# Patient Record
Sex: Male | Born: 1940 | Race: Black or African American | Hispanic: No | Smoking: Former smoker
Health system: Southern US, Community
[De-identification: ages and names within clinical notes are randomized; demographics above are authoritative.]

## PROBLEM LIST (undated history)

## (undated) DIAGNOSIS — T7840XA Allergy, unspecified, initial encounter: Secondary | ICD-10-CM

## (undated) DIAGNOSIS — I10 Essential (primary) hypertension: Secondary | ICD-10-CM

## (undated) HISTORY — DX: Allergy, unspecified, initial encounter: T78.40XA

## (undated) HISTORY — DX: Essential (primary) hypertension: I10

---

## 1998-01-04 ENCOUNTER — Encounter: Admission: RE | Admit: 1998-01-04 | Discharge: 1998-01-04 | Payer: Self-pay | Admitting: Family Medicine

## 1998-01-11 ENCOUNTER — Encounter: Admission: RE | Admit: 1998-01-11 | Discharge: 1998-01-11 | Payer: Self-pay | Admitting: Family Medicine

## 1998-01-25 ENCOUNTER — Encounter: Admission: RE | Admit: 1998-01-25 | Discharge: 1998-01-25 | Payer: Self-pay | Admitting: Family Medicine

## 1998-02-01 ENCOUNTER — Encounter: Admission: RE | Admit: 1998-02-01 | Discharge: 1998-02-01 | Payer: Self-pay | Admitting: Family Medicine

## 1998-02-08 ENCOUNTER — Encounter: Admission: RE | Admit: 1998-02-08 | Discharge: 1998-02-08 | Payer: Self-pay | Admitting: Family Medicine

## 1998-02-15 ENCOUNTER — Encounter: Admission: RE | Admit: 1998-02-15 | Discharge: 1998-02-15 | Payer: Self-pay | Admitting: Family Medicine

## 1998-03-08 ENCOUNTER — Encounter: Admission: RE | Admit: 1998-03-08 | Discharge: 1998-03-08 | Payer: Self-pay | Admitting: Family Medicine

## 1998-03-15 ENCOUNTER — Encounter: Admission: RE | Admit: 1998-03-15 | Discharge: 1998-03-15 | Payer: Self-pay | Admitting: Family Medicine

## 1998-04-05 ENCOUNTER — Encounter: Admission: RE | Admit: 1998-04-05 | Discharge: 1998-04-05 | Payer: Self-pay | Admitting: Family Medicine

## 1998-04-19 ENCOUNTER — Encounter: Admission: RE | Admit: 1998-04-19 | Discharge: 1998-04-19 | Payer: Self-pay | Admitting: Family Medicine

## 1998-05-10 ENCOUNTER — Encounter: Admission: RE | Admit: 1998-05-10 | Discharge: 1998-05-10 | Payer: Self-pay | Admitting: Family Medicine

## 1998-05-31 ENCOUNTER — Encounter: Admission: RE | Admit: 1998-05-31 | Discharge: 1998-05-31 | Payer: Self-pay | Admitting: Family Medicine

## 1998-06-14 ENCOUNTER — Encounter: Admission: RE | Admit: 1998-06-14 | Discharge: 1998-06-14 | Payer: Self-pay | Admitting: Family Medicine

## 1998-07-05 ENCOUNTER — Encounter: Admission: RE | Admit: 1998-07-05 | Discharge: 1998-07-05 | Payer: Self-pay | Admitting: Family Medicine

## 1998-07-12 ENCOUNTER — Encounter: Admission: RE | Admit: 1998-07-12 | Discharge: 1998-07-12 | Payer: Self-pay | Admitting: Family Medicine

## 1998-07-19 ENCOUNTER — Encounter: Admission: RE | Admit: 1998-07-19 | Discharge: 1998-07-19 | Payer: Self-pay | Admitting: Family Medicine

## 1998-08-02 ENCOUNTER — Encounter: Admission: RE | Admit: 1998-08-02 | Discharge: 1998-08-02 | Payer: Self-pay | Admitting: Family Medicine

## 1998-08-09 ENCOUNTER — Encounter: Admission: RE | Admit: 1998-08-09 | Discharge: 1998-08-09 | Payer: Self-pay | Admitting: Family Medicine

## 1998-08-30 ENCOUNTER — Encounter: Admission: RE | Admit: 1998-08-30 | Discharge: 1998-08-30 | Payer: Self-pay | Admitting: Family Medicine

## 1998-09-04 ENCOUNTER — Encounter: Admission: RE | Admit: 1998-09-04 | Discharge: 1998-09-04 | Payer: Self-pay | Admitting: Family Medicine

## 1998-09-13 ENCOUNTER — Encounter: Admission: RE | Admit: 1998-09-13 | Discharge: 1998-09-13 | Payer: Self-pay | Admitting: Family Medicine

## 1998-10-04 ENCOUNTER — Encounter: Admission: RE | Admit: 1998-10-04 | Discharge: 1998-10-04 | Payer: Self-pay | Admitting: Family Medicine

## 1998-10-14 ENCOUNTER — Encounter: Admission: RE | Admit: 1998-10-14 | Discharge: 1998-10-14 | Payer: Self-pay | Admitting: Family Medicine

## 1998-10-25 ENCOUNTER — Encounter: Admission: RE | Admit: 1998-10-25 | Discharge: 1998-10-25 | Payer: Self-pay | Admitting: Family Medicine

## 1998-11-01 ENCOUNTER — Encounter: Admission: RE | Admit: 1998-11-01 | Discharge: 1998-11-01 | Payer: Self-pay | Admitting: Family Medicine

## 1998-11-15 ENCOUNTER — Encounter: Admission: RE | Admit: 1998-11-15 | Discharge: 1998-11-15 | Payer: Self-pay | Admitting: Family Medicine

## 1999-07-07 ENCOUNTER — Encounter: Admission: RE | Admit: 1999-07-07 | Discharge: 1999-07-07 | Payer: Self-pay | Admitting: Family Medicine

## 1999-10-02 ENCOUNTER — Encounter: Admission: RE | Admit: 1999-10-02 | Discharge: 1999-10-02 | Payer: Self-pay | Admitting: Family Medicine

## 1999-10-24 ENCOUNTER — Encounter: Admission: RE | Admit: 1999-10-24 | Discharge: 1999-10-24 | Payer: Self-pay | Admitting: Family Medicine

## 1999-11-14 ENCOUNTER — Encounter: Admission: RE | Admit: 1999-11-14 | Discharge: 1999-11-14 | Payer: Self-pay | Admitting: Family Medicine

## 2000-02-06 ENCOUNTER — Encounter: Admission: RE | Admit: 2000-02-06 | Discharge: 2000-02-06 | Payer: Self-pay | Admitting: Family Medicine

## 2000-07-23 ENCOUNTER — Encounter: Admission: RE | Admit: 2000-07-23 | Discharge: 2000-07-23 | Payer: Self-pay | Admitting: Family Medicine

## 2000-08-06 ENCOUNTER — Encounter: Admission: RE | Admit: 2000-08-06 | Discharge: 2000-08-06 | Payer: Self-pay | Admitting: Family Medicine

## 2000-08-26 ENCOUNTER — Encounter: Admission: RE | Admit: 2000-08-26 | Discharge: 2000-08-26 | Payer: Self-pay | Admitting: Sports Medicine

## 2001-02-17 ENCOUNTER — Encounter: Admission: RE | Admit: 2001-02-17 | Discharge: 2001-02-17 | Payer: Self-pay | Admitting: Family Medicine

## 2001-03-14 ENCOUNTER — Encounter: Admission: RE | Admit: 2001-03-14 | Discharge: 2001-03-14 | Payer: Self-pay | Admitting: Family Medicine

## 2001-05-20 ENCOUNTER — Encounter: Admission: RE | Admit: 2001-05-20 | Discharge: 2001-05-20 | Payer: Self-pay | Admitting: Family Medicine

## 2001-06-10 ENCOUNTER — Encounter: Admission: RE | Admit: 2001-06-10 | Discharge: 2001-06-10 | Payer: Self-pay | Admitting: Family Medicine

## 2001-08-05 ENCOUNTER — Encounter: Admission: RE | Admit: 2001-08-05 | Discharge: 2001-08-05 | Payer: Self-pay | Admitting: Pediatrics

## 2001-08-05 ENCOUNTER — Ambulatory Visit (HOSPITAL_COMMUNITY): Admission: RE | Admit: 2001-08-05 | Discharge: 2001-08-05 | Payer: Self-pay | Admitting: Family Medicine

## 2001-08-18 ENCOUNTER — Encounter: Admission: RE | Admit: 2001-08-18 | Discharge: 2001-08-18 | Payer: Self-pay | Admitting: Family Medicine

## 2001-08-18 ENCOUNTER — Encounter: Payer: Self-pay | Admitting: Family Medicine

## 2001-08-24 ENCOUNTER — Encounter: Admission: RE | Admit: 2001-08-24 | Discharge: 2001-08-24 | Payer: Self-pay | Admitting: Family Medicine

## 2001-12-02 ENCOUNTER — Encounter: Payer: Self-pay | Admitting: Family Medicine

## 2001-12-02 ENCOUNTER — Encounter: Admission: RE | Admit: 2001-12-02 | Discharge: 2001-12-02 | Payer: Self-pay | Admitting: Family Medicine

## 2001-12-19 ENCOUNTER — Encounter: Admission: RE | Admit: 2001-12-19 | Discharge: 2001-12-19 | Payer: Self-pay | Admitting: Family Medicine

## 2002-05-05 ENCOUNTER — Encounter: Admission: RE | Admit: 2002-05-05 | Discharge: 2002-05-05 | Payer: Self-pay | Admitting: Family Medicine

## 2002-05-26 ENCOUNTER — Encounter: Admission: RE | Admit: 2002-05-26 | Discharge: 2002-05-26 | Payer: Self-pay | Admitting: Family Medicine

## 2002-08-02 ENCOUNTER — Encounter: Admission: RE | Admit: 2002-08-02 | Discharge: 2002-08-02 | Payer: Self-pay | Admitting: Family Medicine

## 2002-10-13 ENCOUNTER — Encounter: Admission: RE | Admit: 2002-10-13 | Discharge: 2002-10-13 | Payer: Self-pay | Admitting: Family Medicine

## 2002-10-13 ENCOUNTER — Encounter: Admission: RE | Admit: 2002-10-13 | Discharge: 2002-10-13 | Payer: Self-pay | Admitting: Sports Medicine

## 2002-10-13 ENCOUNTER — Encounter: Payer: Self-pay | Admitting: Sports Medicine

## 2003-08-10 ENCOUNTER — Encounter: Admission: RE | Admit: 2003-08-10 | Discharge: 2003-08-10 | Payer: Self-pay | Admitting: Family Medicine

## 2003-08-10 ENCOUNTER — Ambulatory Visit (HOSPITAL_COMMUNITY): Admission: RE | Admit: 2003-08-10 | Discharge: 2003-08-10 | Payer: Self-pay | Admitting: Family Medicine

## 2004-07-14 ENCOUNTER — Ambulatory Visit: Payer: Self-pay | Admitting: Family Medicine

## 2004-08-11 ENCOUNTER — Ambulatory Visit: Payer: Self-pay | Admitting: Family Medicine

## 2004-09-02 ENCOUNTER — Ambulatory Visit: Payer: Self-pay | Admitting: Sports Medicine

## 2005-06-09 ENCOUNTER — Ambulatory Visit: Payer: Self-pay | Admitting: Family Medicine

## 2005-07-27 ENCOUNTER — Ambulatory Visit: Payer: Self-pay | Admitting: Family Medicine

## 2005-08-10 ENCOUNTER — Ambulatory Visit: Payer: Self-pay | Admitting: Family Medicine

## 2005-10-26 ENCOUNTER — Ambulatory Visit: Payer: Self-pay | Admitting: Family Medicine

## 2006-03-05 ENCOUNTER — Ambulatory Visit: Payer: Self-pay | Admitting: Family Medicine

## 2006-03-08 ENCOUNTER — Ambulatory Visit (HOSPITAL_COMMUNITY): Admission: RE | Admit: 2006-03-08 | Discharge: 2006-03-08 | Payer: Self-pay | Admitting: Family Medicine

## 2006-03-29 ENCOUNTER — Ambulatory Visit: Payer: Self-pay | Admitting: Family Medicine

## 2006-08-06 ENCOUNTER — Ambulatory Visit: Payer: Self-pay | Admitting: Family Medicine

## 2006-08-17 ENCOUNTER — Encounter: Admission: RE | Admit: 2006-08-17 | Discharge: 2006-08-17 | Payer: Self-pay | Admitting: Family Medicine

## 2006-11-25 DIAGNOSIS — I10 Essential (primary) hypertension: Secondary | ICD-10-CM

## 2006-11-25 DIAGNOSIS — J309 Allergic rhinitis, unspecified: Secondary | ICD-10-CM | POA: Insufficient documentation

## 2006-11-25 DIAGNOSIS — M479 Spondylosis, unspecified: Secondary | ICD-10-CM | POA: Insufficient documentation

## 2006-11-25 HISTORY — DX: Essential (primary) hypertension: I10

## 2006-12-09 ENCOUNTER — Telehealth: Payer: Self-pay | Admitting: Family Medicine

## 2007-08-01 ENCOUNTER — Ambulatory Visit: Payer: Self-pay | Admitting: Family Medicine

## 2007-08-01 LAB — CONVERTED CEMR LAB
Albumin: 4.6 g/dL (ref 3.5–5.2)
BUN: 18 mg/dL (ref 6–23)
CO2: 25 meq/L (ref 19–32)
Calcium: 9.6 mg/dL (ref 8.4–10.5)
Cholesterol: 177 mg/dL (ref 0–200)
Glucose, Bld: 75 mg/dL (ref 70–99)
HDL: 77 mg/dL (ref >39)
Hemoglobin: 13.8 g/dL (ref 13.0–17.0)
Potassium: 4.3 meq/L (ref 3.5–5.3)
RBC: 4.73 M/uL (ref 4.22–5.81)
Sodium: 142 meq/L (ref 135–145)
Total Protein: 8.1 g/dL (ref 6.0–8.3)
Triglycerides: 47 mg/dL (ref <150)
WBC: 4.2 10*3/uL (ref 4.0–10.5)

## 2007-11-04 ENCOUNTER — Encounter: Payer: Self-pay | Admitting: Family Medicine

## 2007-12-27 ENCOUNTER — Encounter: Payer: Self-pay | Admitting: Family Medicine

## 2007-12-27 LAB — CONVERTED CEMR LAB
ALT: 29 units/L
AST: 27 units/L
Albumin: 4.4 g/dL
Alkaline Phosphatase: 46 units/L
CO2: 30 meq/L
HDL: 76 mg/dL
Total Protein: 8.5 g/dL

## 2008-01-03 ENCOUNTER — Encounter: Payer: Self-pay | Admitting: Family Medicine

## 2008-01-03 LAB — CONVERTED CEMR LAB
HCT: 42.1 %
Hemoglobin: 13.7 g/dL
MCV: 90.9 fL
Platelets: 231 10*3/uL
RBC: 4.63 M/uL

## 2008-01-31 ENCOUNTER — Encounter: Payer: Self-pay | Admitting: Family Medicine

## 2008-02-23 ENCOUNTER — Encounter: Payer: Self-pay | Admitting: Family Medicine

## 2008-07-20 ENCOUNTER — Telehealth: Payer: Self-pay | Admitting: Family Medicine

## 2008-08-01 ENCOUNTER — Ambulatory Visit: Payer: Self-pay | Admitting: Family Medicine

## 2008-08-01 ENCOUNTER — Encounter: Payer: Self-pay | Admitting: Family Medicine

## 2008-08-01 LAB — CONVERTED CEMR LAB
BUN: 20 mg/dL (ref 6–23)
CO2: 26 meq/L (ref 19–32)
Chloride: 102 meq/L (ref 96–112)
Potassium: 4.5 meq/L (ref 3.5–5.3)

## 2008-08-03 ENCOUNTER — Ambulatory Visit: Payer: Self-pay | Admitting: Family Medicine

## 2009-01-17 ENCOUNTER — Encounter: Payer: Self-pay | Admitting: Family Medicine

## 2009-06-25 ENCOUNTER — Ambulatory Visit: Payer: Self-pay | Admitting: Family Medicine

## 2009-06-25 LAB — CONVERTED CEMR LAB
AST: 25 units/L (ref 0–37)
Albumin: 4.8 g/dL (ref 3.5–5.2)
BUN: 21 mg/dL (ref 6–23)
CO2: 26 meq/L (ref 19–32)
Calcium: 9.5 mg/dL (ref 8.4–10.5)
Chloride: 101 meq/L (ref 96–112)
Glucose, Bld: 115 mg/dL — ABNORMAL HIGH (ref 70–99)
Potassium: 3.8 meq/L (ref 3.5–5.3)

## 2009-08-07 ENCOUNTER — Ambulatory Visit: Payer: Self-pay | Admitting: Family Medicine

## 2009-08-07 LAB — CONVERTED CEMR LAB
ALT: 29 units/L (ref 0–53)
AST: 30 units/L (ref 0–37)
Albumin: 4.7 g/dL (ref 3.5–5.2)
Alkaline Phosphatase: 46 units/L (ref 39–117)
BUN: 23 mg/dL (ref 6–23)
Creatinine, Ser: 1.03 mg/dL (ref 0.40–1.50)
HDL: 82 mg/dL (ref >39)
LDL Cholesterol: 81 mg/dL (ref 0–99)
PSA: 0.44 ng/mL (ref 0.10–4.00)
Potassium: 3.9 meq/L (ref 3.5–5.3)
Total CHOL/HDL Ratio: 2.1

## 2010-01-31 ENCOUNTER — Encounter: Payer: Self-pay | Admitting: Family Medicine

## 2010-01-31 LAB — CONVERTED CEMR LAB
Alkaline Phosphatase: 55 units/L
BUN: 24 mg/dL
Bilirubin, Direct: 0.2 mg/dL
Cholesterol: 174 mg/dL
Creatinine, Ser: 0.9 mg/dL
HDL: 90 mg/dL
LDL Cholesterol: 73 mg/dL
Triglycerides: 49 mg/dL

## 2010-07-22 ENCOUNTER — Encounter: Payer: Self-pay | Admitting: *Deleted

## 2010-07-30 ENCOUNTER — Encounter: Payer: Self-pay | Admitting: Family Medicine

## 2010-08-06 ENCOUNTER — Ambulatory Visit: Payer: Self-pay | Admitting: Family Medicine

## 2010-08-06 LAB — CONVERTED CEMR LAB: PSA: 0.42 ng/mL (ref <=4.00)

## 2010-10-30 NOTE — Miscellaneous (Signed)
Summary: received flu vaccine  Clinical Lists Changes received notification from  [facility] that patient received flu vaccine 07/05/2010. Theresia Lo RN  July 22, 2010 5:03 PM  Observations: Added new observation of FLU VAX: Historical (07/05/2010 17:03)      Influenza Immunization History:    Influenza # 1:  Historical (07/05/2010)

## 2010-10-30 NOTE — Miscellaneous (Signed)
[facility]  Clinical Lists Changes  Observations: Added new observation of OTHER X-RAY: Exam Type: Knee left knee, spurring - unchanged from 3/09 Exam Type: Shoulder Left shoulder spurring  (01/31/2010 10:43) Added new observation of LDL: 73 mg/dL (04/54/0981 19:14) Added new observation of HDL: 90 mg/dL (78/29/5621 30:86) Added new observation of TRIGLYC TOT: 49 mg/dL (57/84/6962 95:28) Added new observation of CHOLESTEROL: 174 mg/dL (41/32/4401 02:72) Added new observation of PSA: 2.01 ng/mL (01/31/2010 10:40) Added new observation of CALCIUM: 9.2 mg/dL (53/66/4403 47:42) Added new observation of ALBUMIN: 4.1 g/dL (59/56/3875 64:33) Added new observation of PROTEIN, TOT: 8.4 g/dL (29/51/8841 66:06) Added new observation of SGPT (ALT): 33 units/L (01/31/2010 10:40) Added new observation of SGOT (AST): 29 units/L (01/31/2010 10:40) Added new observation of ALK PHOS: 55 units/L (01/31/2010 10:40) Added new observation of BILI DIRECT: 0.2 mg/dL (30/16/0109 32:35) Added new observation of BILI TOTAL: 0.9 mg/dL (57/32/2025 42:70) Added new observation of CREATININE: 0.9 mg/dL (62/37/6283 15:17) Added new observation of BUN: 24 mg/dL (61/60/7371 06:26) Added new observation of BG RANDOM: 73 mg/dL (94/85/4627 03:50) Added new observation of CO2 PLSM/SER: 27 meq/L (01/31/2010 10:40) Added new observation of CL SERUM: 102 meq/L (01/31/2010 10:40) Added new observation of K SERUM: 4.1 meq/L (01/31/2010 10:40) Added new observation of NA: 139 meq/L (01/31/2010 10:40) Added new observation of HGBA1C: 5.6 % (01/31/2010 10:40)      X-ray Musculoskeletal  Procedure date:  01/31/2010  Findings:      Exam Type: Knee left knee, spurring - unchanged from 3/09 Exam Type: Shoulder Left shoulder spurring    X-ray Musculoskeletal  Procedure date:  01/31/2010  Findings:      Exam Type: Knee left knee, spurring - unchanged from 3/09 Exam Type: Shoulder Left shoulder spurring

## 2010-10-30 NOTE — Assessment & Plan Note (Signed)
Summary: cpe,df   Vital Signs:  Patient profile:   70 year old male Weight:      203 pounds BMI:     29.23 Temp:     97.8 degrees F oral Pulse rate:   59 / minute Pulse rhythm:   regular BP sitting:   138 / 83  (left arm) Cuff size:   large  Vitals Entered By: Doralee Albino MD (August 06, 2010 3:05 PM) CC: cpe Is Patient Diabetic? No   CC:  cpe.  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  No bad health habits.  Has been to [place] and they did blood work which I copied and entered into EMR.  He is up to date on most things.  The only concern is a jump in his PSA although it is still in the normal range.    Habits & Providers  Alcohol-Tobacco-Diet     Alcohol drinks/day: <1     Tobacco Status: quit     Year Quit: 1976     Diet Comments: Healthy diet  Exercise-Depression-Behavior     Does Patient Exercise: yes     Exercise Counseling: not indicated; exercise is adequate     Type of exercise: walking     Have you felt down or hopeless? no     Have you felt little pleasure in things? no     Depression Counseling: not indicated; screening negative for depression     STD Risk: never     Drug Use: never     Seat Belt Use: always     Sun Exposure: infrequent  Current Medications (verified): 1)  Aspirin Ec 81 Mg Tbec (Aspirin) .... Take 1 Tablet By Mouth Every Morning 2)  Hydrochlorothiazide 25 Mg  Tabs (Hydrochlorothiazide) .... One Tab By Mouth Daily 3)  Lisinopril 20 Mg Tabs (Lisinopril) .... One By Mouth Twice Daily 4)  Loratadine 10 Mg Tabs (Loratadine) .... Take 1 Tablet By Mouth Once A Day 5)  Nasonex 50 Mcg/act Susp (Mometasone Furoate) .... Spray 2 Spray Into Both Nostrils At Bedtime 6)  Ra Fish Oil 1000 Mg Caps (Omega-3 Fatty Acids) .... Three By Mouth Daily 7)  Vitamin C 500 Mg Tabs (Ascorbic Acid) .... One By Mouth Daily  Allergies (verified): No Known Drug Allergies  Past History:  Past Medical  History: Last updated: 08/07/2009 Also takes Omega 3 vit qd, spinal stenosis by MRI, mild sx, zostavax 11/06  Had AAA screen 11/07  Past Surgical History: Last updated: 11/25/2006 08/06/06 ldl: 97 hdl: 74 tri: 40 - 60/45/4098, chest tube for spontaneous pneumothorax -, MRI LS spine - 03/30/2006  Family History: Last updated: 11/25/2006 - CVA, +DM, HBP, Ca, CAD  Social History: Last updated: 11/25/2006 non smoker; ETOH insignificant; exercises 3x/week regular and deep water running; diet very healthy; 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    Physical Exam  General:  Well-developed,well-nourished,in no acute distress; alert,appropriate and cooperative throughout examination Mouth:  Oral mucosa and oropharynx  without lesions or exudates.  Teeth in good repair. Neck:  No deformities, masses, or tenderness noted. Chest Wall:  No deformities, masses, tenderness or gynecomastia noted. Lungs:  Normal respiratory effort, chest expands symmetrically. Lungs are clear to auscultation, no crackles or wheezes. Heart:  Normal rate and regular rhythm. S1 and S2 normal without gallop, murmur, click, rub or other extra sounds. Abdomen:  Bowel sounds positive,abdomen soft and non-tender without masses, organomegaly or hernias noted. Msk:  No deformity or scoliosis noted of thoracic or lumbar spine.     Extremities:  No clubbing, cyanosis, edema, or deformity noted with normal full range of motion of all joints.   Neurologic:  No cranial nerve deficits noted. Station and gait are normal. Plantar reflexes are down-going bilaterally. DTRs are symmetrical throughout. Sensory, motor and coordinative functions appear intact.   Impression & Recommendations:  Problem # 1:  Preventive Health Care (ICD-V70.0)  very healthy  Orders: [facility] - [facility]  65+ [phone])  Problem # 2:  HYPERTENSION, BENIGN SYSTEMIC (ICD-401.1) Assessment: Unchanged  His updated medication list for this problem includes:    Hydrochlorothiazide 25 Mg Tabs (Hydrochlorothiazide) ..... One tab by mouth daily    Lisinopril 20 Mg Tabs (Lisinopril) ..... One by mouth twice daily  BP today: 138/83 Prior BP: 160/87 (08/07/2009)  Labs Reviewed: K+: 4.1 (01/31/2010) Creat: : 0.9 (01/31/2010)   Chol: 174 (01/31/2010)   HDL: 90 (01/31/2010)   LDL: 73 (01/31/2010)   TG: 49 (01/31/2010)  Problem # 3:  SPECIAL SCREENING MALIGNANT NEOPLASM OF PROSTATE (ICD-V76.44) Concerned for jump in PSA Orders: [facility] ([id number])  Complete Medication List: 1)  Aspirin Ec 81 Mg Tbec (Aspirin) .... Take 1 tablet by mouth every morning 2)  Hydrochlorothiazide 25 Mg Tabs (Hydrochlorothiazide) .... One tab by mouth daily 3)  Lisinopril 20 Mg Tabs (Lisinopril) .... One by mouth twice daily 4)  Loratadine 10 Mg Tabs (Loratadine) .... Take 1 tablet by mouth once a day 5)  Nasonex 50 Mcg/act Susp (Mometasone furoate) .... Spray 2 spray into both nostrils at bedtime 6)  Ra Fish Oil 1000 Mg Caps (Omega-3 fatty acids) .... Three by mouth daily 7)  Vitamin C 500 Mg Tabs (Ascorbic acid) .... One by mouth daily   Orders Added: 1)  [facility] [phone] 2)  [facility] - Est  65+ [[id number]]     Prevention & Chronic Care Immunizations   Influenza vaccine: Historical  (07/05/2010)   Influenza vaccine due: 05/30/2011    Tetanus booster: 07/29/2001: Done.    Tetanus booster due: 07/30/2011    Pneumococcal vaccine: Pneumovax  (06/25/2009)   Pneumococcal vaccine due: None    H. zoster vaccine: 08/12/2005: given  Colorectal Screening   Hemoccult: Done.  (07/29/2005)   Hemoccult due: Not Indicated    Colonoscopy: Done.  (08/28/2005)   Colonoscopy due: 08/29/2015  Other Screening   PSA: 2.01  (01/31/2010)   PSA ordered.   PSA action/deferral: Discussed-PSA requested  (08/07/2009)   PSA due due: 02/01/2011   Smoking status: quit  (08/06/2010)  Lipids   Total Cholesterol: 174  (01/31/2010)   LDL: 73  (01/31/2010)   LDL Direct: Not documented   HDL: 90  (01/31/2010)   Triglycerides: 49  (01/31/2010)  Hypertension   Last Blood Pressure: 138 / 83  (08/06/2010)   Serum creatinine: 0.9  (01/31/2010)   Serum potassium 4.1  (01/31/2010)    Hypertension flowsheet reviewed?: Yes   Progress toward BP goal: At goal  Self-Management Support :   Personal Goals (by  the next clinic visit) :      Personal blood pressure goal: 140/90  (06/25/2009)   Hypertension self-management support: Written self-care plan  (08/07/2009)

## 2010-11-19 ENCOUNTER — Encounter: Payer: Self-pay | Admitting: Family Medicine

## 2010-11-19 NOTE — Telephone Encounter (Signed)
This encounter was created in error - please disregard.

## 2010-12-09 ENCOUNTER — Encounter: Payer: Self-pay | Admitting: Home Health Services

## 2011-01-08 ENCOUNTER — Encounter: Payer: Self-pay | Admitting: Family Medicine

## 2011-01-08 DIAGNOSIS — M2240 Chondromalacia patellae, unspecified knee: Secondary | ICD-10-CM | POA: Insufficient documentation

## 2011-01-08 NOTE — Progress Notes (Signed)
  Subjective:    Patient ID: Adam Vasquez, male    DOB: 02/13/1941, 70 y.o.   MRN: [id number]  HPI Received records from outpatient ortho visit with [facility] for bilateral mild anterior knee pain with exercise.  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  No intervention    Review of Systems     Objective:   Physical Exam        Assessment & Plan:

## 2011-02-03 ENCOUNTER — Other Ambulatory Visit: Payer: Self-pay | Admitting: Family Medicine

## 2011-02-03 NOTE — Telephone Encounter (Signed)
Refill request

## 2011-03-04 ENCOUNTER — Telehealth: Payer: Self-pay | Admitting: Family Medicine

## 2011-03-04 NOTE — Telephone Encounter (Signed)
Is going to [place] and is asking about shots to go - Hep A,  Will be pulling chart to see if he has had any shots

## 2011-03-06 NOTE — Telephone Encounter (Signed)
He is asking that nurse call to see if there is any indication that he would need a Hep A to go to [place].

## 2011-03-06 NOTE — Telephone Encounter (Signed)
Will route to [employer].

## 2011-03-06 NOTE — Telephone Encounter (Signed)
Checked paper chart  and there is no indication that patient has ever had Hep A vaccine. . Patient notified.  He has contacted [facility] Dept. As to other immunizations that are needed.

## 2011-04-02 ENCOUNTER — Telehealth: Payer: Self-pay | Admitting: Family Medicine

## 2011-04-02 ENCOUNTER — Ambulatory Visit (INDEPENDENT_AMBULATORY_CARE_PROVIDER_SITE_OTHER): Payer: Medicare Other | Admitting: Family Medicine

## 2011-04-02 VITALS — BP 154/86 | HR 66 | Temp 99.2°F | Ht 70.0 in | Wt 205.0 lb

## 2011-04-02 DIAGNOSIS — R52 Pain, unspecified: Secondary | ICD-10-CM

## 2011-04-02 NOTE — Assessment & Plan Note (Signed)
Will do peripheral malaria smear today.  Most likely this is the start of a viral illness. [place] is low risk for malaria for tourist, when pt is taking prophylaxis.  Pt was [place] a higher risk area.  Will review results of smear.  Pt to monitor for new or worsening of symptoms and return promptly if any changes.  Pt states understanding.

## 2011-04-02 NOTE — Patient Instructions (Signed)
I will call you with the malaria smear results. Continue to take doxycycline as directed. Return for new or worsening of symptoms as discussed.

## 2011-04-02 NOTE — Progress Notes (Signed)
  Subjective:    Patient ID: Adam Vasquez, male    DOB: 1941-06-27, 70 y.o.   MRN: [id number]  HPI Got back from [place] on June 23rd (12 days ago).  Sweating, chills that started off and on since yesterday am.  Taking doxycylcine prophylaxis as directed.   + h/a, sorethroat yesterday, lower ext body aches, +fatigue (mild), no n/v, No cough.  No abdominal pain. No cold symptoms.  No problems with urination.  No diarrhea.  Pt states he is concerned that he may have malaria   Review of Systems    as per above Objective:   Physical Exam  Constitutional: He is oriented to person, place, and time. He appears well-developed and well-nourished.  HENT:  Mouth/Throat: Oropharynx is clear and moist.       Minimal throat erythema  Eyes: Pupils are equal, round, and reactive to light.  Neck: Normal range of motion.  Cardiovascular: Normal rate, regular rhythm and normal heart sounds.   No murmur heard. Pulmonary/Chest: Effort normal and breath sounds normal. No respiratory distress. He has no wheezes.  Abdominal: Soft. He exhibits no distension and no mass. There is no tenderness. There is no rebound and no guarding.  Neurological: He is alert and oriented to person, place, and time.  Skin: Skin is warm. No rash noted.          Assessment & Plan:

## 2011-04-02 NOTE — Telephone Encounter (Signed)
Call from pt who just returned from [place] on the 23rd of June, was feeling fine but for the last four days has been feeling very run down, fever and nausea and some vomiiting.  Pt was taking cipro daily but does not think it helped.  Pt would like to be seen.  Told pt to come in at 830 am and we will work him in for an appointment, will send to [facility] team

## 2011-04-03 ENCOUNTER — Encounter: Payer: Self-pay | Admitting: Family Medicine

## 2011-04-03 ENCOUNTER — Telehealth: Payer: Self-pay | Admitting: Family Medicine

## 2011-04-03 NOTE — Telephone Encounter (Signed)
Called pt to notify that the preliminary results are negative.  Final results of thick prep are pending.  If negative I will mail confirmation to pt.  If positive I will call pt and notify by phone. Pt states understanding.

## 2011-05-09 ENCOUNTER — Other Ambulatory Visit: Payer: Self-pay | Admitting: Family Medicine

## 2011-05-10 NOTE — Telephone Encounter (Signed)
Refill request

## 2011-05-19 ENCOUNTER — Ambulatory Visit (INDEPENDENT_AMBULATORY_CARE_PROVIDER_SITE_OTHER): Payer: Medicare Other | Admitting: Home Health Services

## 2011-05-19 ENCOUNTER — Encounter: Payer: Self-pay | Admitting: Home Health Services

## 2011-05-19 VITALS — BP 138/79 | HR 49 | Temp 97.9°F | Ht 69.5 in | Wt 197.5 lb

## 2011-05-19 DIAGNOSIS — Z0000 Encounter for general adult medical examination without abnormal findings: Secondary | ICD-10-CM

## 2011-05-19 NOTE — Progress Notes (Signed)
Patient here for annual wellness visit, patient reports: Risk Factors/Conditions needing evaluation or treatment: Pt does not have any risk factors that need evaluation.  Home Safety: Pt lives with wife in 2 story home.  Pt reports having smoke detectors and adaptive equipment in the bathroom.  Other Information: Corrective lens: Pt wears daily corrective lens and visits eye doctor every 36 months. Dentures: Pt does not have dentures and visits dentist every 6 months. Memory: Pt denies memory problems. Patient's Mini Mental Score (recorded in doc. flowsheet): 30  Balance/Gait: Pt does not have any noticeable impairments.  Balance Abnormal Patient value  Sitting balance    Sit to stand    Attempts to arise    Immediate standing balance    Standing balance    Nudge    Eyes closed- Romberg    Tandem stance    Back lean    Neck Rotation    360 degree turn    Sitting down     Gait Abnormal Patient value  Initiation of gait    Step length-left    Step length-right    Step height-left    Step height-right    Step symmetry    Step continuity    Path deviation    Trunk movement    Walking stance        Annual Wellness Visit Requirements Recorded Today In  Medical, family, social history Past Medical, Family, Social History Section  Current providers Care team  Current medications Medications  Wt, BP, Ht, BMI Vital signs  Hearing assessment (welcome visit) Hearing/vision  Tobacco, alcohol, illicit drug use History  ADL Nurse Assessment  Depression Screening Nurse Assessment  Cognitive impairment Nurse Assessment  Mini Mental Status Document Flowsheet  Fall Risk Nurse Assessment  Home Safety Progress Note  End of Life Planning (welcome visit) Social Documentation  Medicare preventative services Progress Note  Risk factors/conditions needing evaluation/treatment Progress Note  Personalized health advice Patient Instructions, goals, letter  Diet & Exercise Social  Documentation  Emergency Contact Social Documentation  Seat Belts Social Documentation  Sun exposure/protection Social Documentation    Medicare Prevention Plan: Pt is up to date with recommended screenings.   Recommended Medicare Prevention Screenings Men over 37 Test For Frequency Date of Last- BOLD if needed  Colorectal Cancer 1-10 yrs 12/06  Prostate Cancer Never or yearly 11/11  Aortic Aneurysm Once if 65-75 with hx of smoking Discuss with PCP if concerned  Cholesterol 5 yrs 5/11  Diabetes yearly 5/11  HIV yearly declined  Influenza Shot yearly 10/11  Pneumonia Shot once 9/10  Zostavax Shot once 11/06

## 2011-05-19 NOTE — Progress Notes (Signed)
  Subjective:    Patient ID: Adam Vasquez, male    DOB: 09/19/1941, 70 y.o.   MRN: [id number]  HPI I have reviewed this visit and discussed with Adam Vasquez and agree with her documentation.      Review of Systems     Objective:   Physical Exam        Assessment & Plan:

## 2011-05-19 NOTE — Patient Instructions (Signed)
1. Continue to exercise 3-4 times a week for 30 minutes. 2. Continue to focus on eating vegetables 3-4 times a day.

## 2011-08-12 ENCOUNTER — Encounter: Payer: Self-pay | Admitting: Family Medicine

## 2011-08-12 ENCOUNTER — Ambulatory Visit (INDEPENDENT_AMBULATORY_CARE_PROVIDER_SITE_OTHER): Payer: Medicare Other | Admitting: Family Medicine

## 2011-08-12 VITALS — BP 138/84 | Temp 97.6°F | Ht 69.5 in | Wt 198.6 lb

## 2011-08-12 DIAGNOSIS — Z23 Encounter for immunization: Secondary | ICD-10-CM

## 2011-08-12 DIAGNOSIS — Z125 Encounter for screening for malignant neoplasm of prostate: Secondary | ICD-10-CM

## 2011-08-12 DIAGNOSIS — I10 Essential (primary) hypertension: Secondary | ICD-10-CM

## 2011-08-12 DIAGNOSIS — Q828 Other specified congenital malformations of skin: Secondary | ICD-10-CM

## 2011-08-12 LAB — LIPID PANEL
Cholesterol: 179 mg/dL (ref 0–200)
Total CHOL/HDL Ratio: 2.4 Ratio
Triglycerides: 40 mg/dL (ref <150)
VLDL: 8 mg/dL (ref 0–40)

## 2011-08-12 LAB — COMPLETE METABOLIC PANEL WITH GFR
ALT: 19 U/L (ref 0–53)
Alkaline Phosphatase: 47 U/L (ref 39–117)
GFR, Est Non African American: 86 mL/min/{1.73_m2}
Sodium: 139 mEq/L (ref 135–145)
Total Bilirubin: 1.4 mg/dL — ABNORMAL HIGH (ref 0.3–1.2)
Total Protein: 8.2 g/dL (ref 6.0–8.3)

## 2011-08-12 NOTE — Patient Instructions (Signed)
You got a tetanus shot today. You are up to date on the recommended screening test. See me at your convenience for skin tag removal.  Stay off the aspirin for three days prior to the removal.

## 2011-08-13 ENCOUNTER — Encounter: Payer: Self-pay | Admitting: Family Medicine

## 2011-08-14 ENCOUNTER — Encounter: Payer: Medicare Other | Admitting: Family Medicine

## 2011-08-14 DIAGNOSIS — Q828 Other specified congenital malformations of skin: Secondary | ICD-10-CM | POA: Insufficient documentation

## 2011-08-14 NOTE — Assessment & Plan Note (Signed)
Return for elective removal. 

## 2011-08-14 NOTE — Assessment & Plan Note (Signed)
Well controled. 

## 2011-08-14 NOTE — Progress Notes (Signed)
  Subjective:    Patient ID: Adam Vasquez, male    DOB: Jun 23, 1941, 70 y.o.   MRN: [id number]  HPI  Not really an annual exam.  He has had his annual wellness exam recently.  He feels great, is exercising and eating healthy.  His only complaint is of skin tags on neck and bilateral axilla.    Review of Systems     Objective:   Physical Exam BP is well controled Cardiac RRR without m Lungs clear Abd benign Skin, multiple typical skin tags on neck and axilla        Assessment & Plan:

## 2011-08-14 NOTE — Assessment & Plan Note (Signed)
Given vigorous health, continue annual screening to age 70, then stop

## 2011-08-27 ENCOUNTER — Telehealth: Payer: Self-pay | Admitting: Family Medicine

## 2011-08-27 NOTE — Telephone Encounter (Signed)
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 Leveda Anna for being so kind and for calling with his lab results and sending them hrough the mail.  He wanted Dr. Cyndia Diver email address.

## 2011-08-28 NOTE — Telephone Encounter (Signed)
OK to give my e mail address [email or web address]

## 2011-08-28 NOTE — Telephone Encounter (Signed)
Gave email address to his wife.

## 2011-09-09 ENCOUNTER — Ambulatory Visit: Payer: Medicare Other | Admitting: Family Medicine

## 2011-09-18 ENCOUNTER — Encounter: Payer: Self-pay | Admitting: Family Medicine

## 2011-09-18 ENCOUNTER — Ambulatory Visit (INDEPENDENT_AMBULATORY_CARE_PROVIDER_SITE_OTHER): Payer: Medicare Other | Admitting: Family Medicine

## 2011-09-18 DIAGNOSIS — Q828 Other specified congenital malformations of skin: Secondary | ICD-10-CM

## 2011-09-18 NOTE — Assessment & Plan Note (Signed)
Removed and aftercare instructions given.

## 2011-09-18 NOTE — Progress Notes (Signed)
  Subjective:    Patient ID: Adam Vasquez, male    DOB: 1941/02/10, 70 y.o.   MRN: [id number]  HPI For skin tag removal Multiple skin tags in both axilla and at neck. Time out done and informed consent obtained.    Review of Systems     Objective:   Physical Exam  Multiple benign skin tags.  Pedunculated ones removed with cryo anesthesia and simple excision.  20 + tags removed.  Patient tolerated procedure well        Assessment & Plan:

## 2011-09-18 NOTE — Patient Instructions (Signed)
Keep clean and dry.  Use topical neosporin.  Notify me if any signs of infection.

## 2012-01-29 ENCOUNTER — Other Ambulatory Visit: Payer: Self-pay | Admitting: Family Medicine

## 2012-05-18 ENCOUNTER — Encounter: Payer: Self-pay | Admitting: Family Medicine

## 2012-05-18 DIAGNOSIS — Z125 Encounter for screening for malignant neoplasm of prostate: Secondary | ICD-10-CM

## 2012-05-18 DIAGNOSIS — I10 Essential (primary) hypertension: Secondary | ICD-10-CM

## 2012-05-18 NOTE — Assessment & Plan Note (Signed)
Outside labs 03/22/12 HDL=96, LDL=65 Normal BMP

## 2012-05-18 NOTE — Progress Notes (Signed)
Patient ID: Adam Vasquez, male   DOB: 15-Nov-1940, 71 y.o.   MRN: [id number] [facility] labs look good.

## 2012-05-23 ENCOUNTER — Encounter: Payer: Self-pay | Admitting: Home Health Services

## 2012-06-28 ENCOUNTER — Encounter: Payer: Self-pay | Admitting: Home Health Services

## 2012-06-28 ENCOUNTER — Ambulatory Visit (INDEPENDENT_AMBULATORY_CARE_PROVIDER_SITE_OTHER): Payer: Medicare Other | Admitting: Home Health Services

## 2012-06-28 VITALS — BP 144/81 | HR 53 | Temp 97.2°F | Ht 69.5 in | Wt 202.0 lb

## 2012-06-28 DIAGNOSIS — Z0000 Encounter for general adult medical examination without abnormal findings: Secondary | ICD-10-CM

## 2012-06-28 NOTE — Progress Notes (Signed)
Patient here for annual wellness visit, patient reports: Risk Factors/Conditions needing evaluation or treatment: Pt does not have any new risk factors that need evaluation. Home Safety: Pt lives with wife in 2 story home.  Pt reports having smoke detectors. Other Information: Corrective lens: Pt wears daily corrective lens.  Pt reports having eye exams semi-annually. Dentures: Pt does not have dentures. Memory: Pt denies any memory problems. Patient's Mini Mental Score (recorded in doc. flowsheet): 30  Balance/Gait: Pt does not have any noticeable impairment. Balance Abnormal Patient value  Sitting balance    Sit to stand    Attempts to arise    Immediate standing balance    Standing balance    Nudge    Eyes closed- Romberg    Tandem stance    Back lean    Neck Rotation    360 degree turn    Sitting down     Gait Abnormal Patient value  Initiation of gait    Step length-left    Step length-right    Step height-left    Step height-right    Step symmetry    Step continuity    Path deviation    Trunk movement    Walking stance        Annual Wellness Visit Requirements Recorded Today In  Medical, family, social history Past Medical, Family, Social History Section  Current providers Care team  Current medications Medications  Wt, BP, Ht, BMI Vital signs  Tobacco, alcohol, illicit drug use History  ADL Nurse Assessment  Depression Screening Nurse Assessment  Cognitive impairment Nurse Assessment  Mini Mental Status Document Flowsheet  Fall Risk Nurse Assessment  Home Safety Progress Note  End of Life Planning (welcome visit) Social Documentation  Medicare preventative services Progress Note  Risk factors/conditions needing evaluation/treatment Progress Note  Personalized health advice Patient Instructions, goals, letter  Diet & Exercise Social Documentation  Emergency Contact Social Documentation  Seat Belts Social Documentation  Sun exposure/protection Social  Documentation    Medicare Prevention Plan:   Recommended Medicare Prevention Screenings Men over 65 Test For Frequency Date of Last- BOLD if needed  Colorectal Cancer 1-10 yrs 12/06  Prostate Cancer Never or yearly NI  Aortic Aneurysm Once if 65-75 with hx of smoking Pt reported done  Cholesterol 5 yrs 11/12  Diabetes yearly 11/12  HIV yearly declined  Influenza Shot yearly Pt reported done  Pneumonia Shot once 9/10  Zostavax Shot once 11/06

## 2012-06-28 NOTE — Patient Instructions (Signed)
1. Continue exercising 150 minutes a week. 2. Continue focusing diet on fruits and vegetables. 3. Continue working on [employer]. 4. Keep record of blood pressure readings to watch for trend.

## 2012-06-29 ENCOUNTER — Encounter: Payer: Self-pay | Admitting: Home Health Services

## 2012-06-29 NOTE — Progress Notes (Signed)
Patient ID: Adam Vasquez, male   DOB: [birth year], 71 y.o.   MRN: [id number] I have reviewed this visit and discussed with [name] and agree with her documentation.

## 2012-08-19 ENCOUNTER — Ambulatory Visit (INDEPENDENT_AMBULATORY_CARE_PROVIDER_SITE_OTHER): Payer: Medicare Other | Admitting: Family Medicine

## 2012-08-19 ENCOUNTER — Encounter: Payer: Self-pay | Admitting: Family Medicine

## 2012-08-19 ENCOUNTER — Encounter: Payer: Medicare Other | Admitting: Family Medicine

## 2012-08-19 VITALS — BP 124/81 | HR 67 | Temp 97.9°F | Ht 70.0 in | Wt 201.0 lb

## 2012-08-19 DIAGNOSIS — I10 Essential (primary) hypertension: Secondary | ICD-10-CM

## 2012-08-19 DIAGNOSIS — Z0000 Encounter for general adult medical examination without abnormal findings: Secondary | ICD-10-CM | POA: Insufficient documentation

## 2012-08-19 LAB — COMPLETE METABOLIC PANEL WITH GFR
AST: 28 U/L (ref 0–37)
Albumin: 4.5 g/dL (ref 3.5–5.2)
BUN: 19 mg/dL (ref 6–23)
Calcium: 9.7 mg/dL (ref 8.4–10.5)
Chloride: 103 mEq/L (ref 96–112)
Glucose, Bld: 102 mg/dL — ABNORMAL HIGH (ref 70–99)
Potassium: 4.2 mEq/L (ref 3.5–5.3)
Total Protein: 7.8 g/dL (ref 6.0–8.3)

## 2012-08-19 LAB — CBC
MCHC: 33 g/dL (ref 30.0–36.0)
MCV: 89.1 fL (ref 78.0–100.0)
Platelets: 257 10*3/uL (ref 150–400)
RDW: 13.6 % (ref 11.5–15.5)
WBC: 4.1 10*3/uL (ref 4.0–10.5)

## 2012-08-19 LAB — LIPID PANEL: HDL: 72 mg/dL (ref >39)

## 2012-08-19 NOTE — Patient Instructions (Addendum)
Everything looks great.   Please stay on your blood pressure medicines.   I will call with lab results Sign up for My Chart. Have a great Thanksgiving. See me in 6 months.  Sooner if problems

## 2012-08-23 ENCOUNTER — Encounter: Payer: Self-pay | Admitting: Family Medicine

## 2012-08-23 NOTE — Progress Notes (Signed)
  Subjective:    Patient ID: Adam Vasquez, male    DOB: 1941/09/18, 71 y.o.   MRN: [id number]  HPI Very healthy 71 yo male in for annual physical.  Great health habits continue.  Compliant with meds although he would like to get off his HBP meds.  Healthy diet, exercises regularly.  Up to date on HPDP interventions.    Review of Systems  No cp, SOB, syncope, focal neuro sx, abd pain, change in bowel or bladder, bleeding or leg swelling.     Objective:   Physical Exam HEENT normal Neck supple, Lungs clear Cardiac RRR without m or g Abd benign Ext normal pulses without edema Neuro motor and sensory grossly intact.       Assessment & Plan:

## 2012-08-23 NOTE — Assessment & Plan Note (Signed)
Good control

## 2012-08-23 NOTE — Assessment & Plan Note (Signed)
Very healthy, Labs as ordered.

## 2012-10-12 ENCOUNTER — Encounter: Payer: Self-pay | Admitting: Family Medicine

## 2012-10-12 ENCOUNTER — Ambulatory Visit (INDEPENDENT_AMBULATORY_CARE_PROVIDER_SITE_OTHER): Payer: Medicare Other | Admitting: Family Medicine

## 2012-10-12 VITALS — BP 156/88 | HR 60 | Temp 98.7°F

## 2012-10-12 DIAGNOSIS — L821 Other seborrheic keratosis: Secondary | ICD-10-CM

## 2012-10-12 DIAGNOSIS — D492 Neoplasm of unspecified behavior of bone, soft tissue, and skin: Secondary | ICD-10-CM

## 2012-10-12 NOTE — Patient Instructions (Addendum)
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  Please google for more information.  You may be a person who grows several of these.  I will call you with the pathology results - but it may not be until one week from Monday.   Treat the biopsy site like any other skin blister.  Keep clean and cover.  Neosporin.  Call if any signs of infection.

## 2012-10-14 DIAGNOSIS — L821 Other seborrheic keratosis: Secondary | ICD-10-CM

## 2012-10-14 DIAGNOSIS — D492 Neoplasm of unspecified behavior of bone, soft tissue, and skin: Secondary | ICD-10-CM | POA: Insufficient documentation

## 2012-10-14 HISTORY — DX: Other seborrheic keratosis: L82.1

## 2012-10-14 NOTE — Assessment & Plan Note (Signed)
Uncertain type and growing, hence biopsy.  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

## 2012-10-14 NOTE — Progress Notes (Signed)
  Subjective:    Patient ID: Adam Vasquez, male    DOB: 1941/07/28, 72 y.o.   MRN: [id number]  HPI Skin lesion on thigh which is growing/changing.  Concerned.  No other complaints.    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  On Rt anterior thigh and measures 1x1cm.  After informed consent and time out.  Area anesthetized and shave biopsy done.  Instructions given.  Patient tolerated procedure well.  Lesion sent to path.        Assessment & Plan:

## 2012-10-15 ENCOUNTER — Encounter: Payer: Self-pay | Admitting: Family Medicine

## 2012-11-12 ENCOUNTER — Other Ambulatory Visit: Payer: Self-pay | Admitting: Family Medicine

## 2013-02-17 ENCOUNTER — Other Ambulatory Visit: Payer: Self-pay | Admitting: Family Medicine

## 2013-05-23 ENCOUNTER — Other Ambulatory Visit: Payer: Self-pay | Admitting: *Deleted

## 2013-05-24 ENCOUNTER — Other Ambulatory Visit: Payer: Self-pay | Admitting: *Deleted

## 2013-05-25 MED ORDER — HYDROCHLOROTHIAZIDE 25 MG PO TABS: ORAL_TABLET | ORAL | Status: DC

## 2013-08-04 ENCOUNTER — Encounter: Payer: Medicare Other | Admitting: Family Medicine

## 2013-08-23 ENCOUNTER — Ambulatory Visit (INDEPENDENT_AMBULATORY_CARE_PROVIDER_SITE_OTHER): Payer: Medicare Other | Admitting: Family Medicine

## 2013-08-23 ENCOUNTER — Encounter: Payer: Self-pay | Admitting: Family Medicine

## 2013-08-23 VITALS — BP 132/68 | HR 58 | Temp 97.7°F | Ht 70.0 in | Wt 197.0 lb

## 2013-08-23 DIAGNOSIS — M2241 Chondromalacia patellae, right knee: Secondary | ICD-10-CM

## 2013-08-23 DIAGNOSIS — I10 Essential (primary) hypertension: Secondary | ICD-10-CM

## 2013-08-23 DIAGNOSIS — Z0000 Encounter for general adult medical examination without abnormal findings: Secondary | ICD-10-CM

## 2013-08-23 DIAGNOSIS — Z23 Encounter for immunization: Secondary | ICD-10-CM

## 2013-08-23 DIAGNOSIS — M2240 Chondromalacia patellae, unspecified knee: Secondary | ICD-10-CM

## 2013-08-23 LAB — CBC
HCT: 40.6 % (ref 39.0–52.0)
MCV: 90 fL (ref 78.0–100.0)
RDW: 13.4 % (ref 11.5–15.5)
WBC: 3.2 10*3/uL — ABNORMAL LOW (ref 4.0–10.5)

## 2013-08-23 LAB — COMPLETE METABOLIC PANEL WITH GFR
CO2: 27 mEq/L (ref 19–32)
Creat: 1 mg/dL (ref 0.50–1.35)
GFR, Est African American: 87 mL/min
GFR, Est Non African American: 75 mL/min
Glucose, Bld: 101 mg/dL — ABNORMAL HIGH (ref 70–99)
Total Bilirubin: 1.5 mg/dL — ABNORMAL HIGH (ref 0.3–1.2)

## 2013-08-23 LAB — LIPID PANEL
HDL: 76 mg/dL (ref >39)
Triglycerides: 40 mg/dL (ref <150)

## 2013-08-23 MED ORDER — DICLOFENAC SODIUM 1 % TD GEL: 2.0000 g | Freq: Four times a day (QID) | TRANSDERMAL | Status: DC

## 2013-08-23 MED ORDER — LISINOPRIL 20 MG PO TABS: ORAL_TABLET | ORAL | Status: DC

## 2013-08-23 MED ORDER — HYDROCHLOROTHIAZIDE 25 MG PO TABS: ORAL_TABLET | ORAL | Status: DC

## 2013-08-23 NOTE — Progress Notes (Signed)
   Subjective:    Patient ID: TARRENCE ENCK, male    DOB: 11-17-1940, 72 y.o.   MRN: [id number]  HPI  Annual exam.  No complaints.  (Minor right knee pain).  Still with vigorous exercise both aerobic and resistance.  Taking BP meds without any apparent side effects.   Up to date on health maint.  (got flu shot elsewhere.) HBP - low salt diet. Cholesterol has been great in the past      Review of Systems Denies wt change, appetite change, bleeding, worrisome skin problems, bowel, bladder changes.  No CP, SOB or leg swelling     Objective:   Physical Exam HEENT normal Neck supple without masses Lungs clear Cardiac RRR without m or g Abd benign Ext no edema.  Rt knee crepitus with patellar compression.        Assessment & Plan:

## 2013-08-23 NOTE — Assessment & Plan Note (Signed)
Given VMO exercises.

## 2013-08-23 NOTE — Patient Instructions (Signed)
Great job on staying healthy. Remember that exercise for strengthening your knee. You might want to look more at the statin/cholesterol recommendations.  You are recommended based on last years numbers.   I will call and send copy of labs. You are doing so well, I think once a year is fine. You received the conjugated pneumonia vaccine today (Prevnar)

## 2013-08-23 NOTE — Assessment & Plan Note (Addendum)
Very healthy male with good habits and no at risk behavior.  Conjugated pneumococcal per new recs.

## 2013-08-23 NOTE — Assessment & Plan Note (Addendum)
Calculated10 y CAD risk=18% based on last year numbers. We discussed statin and he respectfully declines at this point.  We will revisit. Check labs and cont current Rx

## 2013-08-28 ENCOUNTER — Encounter: Payer: Self-pay | Admitting: Family Medicine

## 2014-01-12 ENCOUNTER — Telehealth: Payer: Self-pay | Admitting: Family Medicine

## 2014-01-12 NOTE — Telephone Encounter (Signed)
Patient calls, recently been having back spasms and requesting that Dr. Andria Frames call in rx for Flexeril.

## 2014-01-15 MED ORDER — CYCLOBENZAPRINE HCL 10 MG PO TABS: 10.0000 mg | ORAL_TABLET | Freq: Three times a day (TID) | ORAL | Status: DC | PRN

## 2014-01-15 NOTE — Telephone Encounter (Signed)
Spoke with patient and informed him of below 

## 2014-01-25 ENCOUNTER — Encounter: Payer: Self-pay | Admitting: Family Medicine

## 2014-01-25 NOTE — Progress Notes (Signed)
Patient ID: Adam Vasquez, male   DOB: 06/27/1941, 73 y.o.   MRN: [id number] [facility]/14 Normal CBC Great lipids HDL=92, LDL=59 Tri=35 Nl CMP

## 2014-02-02 ENCOUNTER — Telehealth: Payer: Self-pay | Admitting: Family Medicine

## 2014-02-02 NOTE — Telephone Encounter (Signed)
Pt called and wanted Dr. Andria Frames to know that he does not need a copy of his test results. He said that he already had a copy of them jw

## 2014-03-07 ENCOUNTER — Telehealth: Payer: Self-pay | Admitting: Family Medicine

## 2014-03-21 NOTE — Telephone Encounter (Signed)
Called to schedule SAWV. When pt returns call please schedule appt. for 30 minutes.

## 2014-03-22 ENCOUNTER — Ambulatory Visit (INDEPENDENT_AMBULATORY_CARE_PROVIDER_SITE_OTHER): Payer: Medicare Other | Admitting: Home Health Services

## 2014-03-22 ENCOUNTER — Encounter: Payer: Self-pay | Admitting: Home Health Services

## 2014-03-22 VITALS — BP 132/68 | HR 62 | Temp 97.7°F | Ht 70.0 in | Wt 202.1 lb

## 2014-03-22 DIAGNOSIS — Z0000 Encounter for general adult medical examination without abnormal findings: Secondary | ICD-10-CM

## 2014-03-22 NOTE — Progress Notes (Signed)
Patient here for annual wellness visit, patient reports: Risk Factors/Conditions needing evaluation or treatment: Pt. Reports not having any risk factors that need evaluation.  Home Safety: Pt lives at home, with his wife in a 2 story home.  Pt reports having smoke alarms and has adaptive equipment.  Other Information: Corrective lens: Pt has daily corrective lens, has annual eye exams. Dentures: Pt does not have dentures, has annual dental exams. Memory: Pt reports no memory problems.  Patient's Mini Mental Score (recorded in doc. flowsheet): 28 Bladder:  Pt reports no problems with bladder control.  ADL/IADL:  Pt reports independence in all functions. Balance/Gait: Pt reports 0 falls in the past year.  We discussed home safety and fall prevention.     Pt reports no problem with his hearing and declined hearing exam at this time.    Pt will bring copy of living will and recent influenza shot to next appointment for chart records.    Annual Wellness Visit Requirements Recorded Today In  Medical, family, social history Past Medical, Family, Social History Section  Current providers Care team  Current medications Medications  Wt, BP, Ht, BMI Vital signs        Tobacco, alcohol, illicit drug use History  ADL Nurse Assessment  Depression Screening Nurse Assessment  Cognitive impairment Nurse Assessment  Mini Mental Status Document Flowsheet  Fall Risk Fall/Depression  Home Safety Progress Note  End of Life Planning (welcome visit) Social Documentation  Medicare preventative services Progress Note  Risk factors/conditions needing evaluation/treatment Progress Note  Personalized health advice Patient Instructions, goals, letter  Diet & Exercise Social Documentation  Emergency Contact Social Documentation  Seat Belts Social Documentation  Sun exposure/protection Social Documentation

## 2014-03-22 NOTE — Progress Notes (Signed)
I have reviewed and agree with [facility] Cover's documentation.  Adam Vasquez

## 2014-03-23 ENCOUNTER — Encounter: Payer: Self-pay | Admitting: Home Health Services

## 2014-03-23 NOTE — Progress Notes (Signed)
Patient ID: Adam Vasquez, male   DOB: Jun 17, 1941, 73 y.o.   MRN: [id number] I have reviewed this visit and discussed with Lamont Dowdy and agree with her documentation.

## 2014-08-15 ENCOUNTER — Encounter: Payer: Medicare Other | Admitting: Family Medicine

## 2014-08-17 ENCOUNTER — Other Ambulatory Visit: Payer: Self-pay | Admitting: Family Medicine

## 2014-08-29 ENCOUNTER — Ambulatory Visit (INDEPENDENT_AMBULATORY_CARE_PROVIDER_SITE_OTHER): Payer: Medicare Other | Admitting: Family Medicine

## 2014-08-29 ENCOUNTER — Encounter: Payer: Self-pay | Admitting: Family Medicine

## 2014-08-29 VITALS — BP 120/84 | HR 60 | Ht 70.0 in | Wt 204.9 lb

## 2014-08-29 DIAGNOSIS — M720 Palmar fascial fibromatosis [Dupuytren]: Secondary | ICD-10-CM | POA: Insufficient documentation

## 2014-08-29 DIAGNOSIS — Z0000 Encounter for general adult medical examination without abnormal findings: Secondary | ICD-10-CM

## 2014-08-29 DIAGNOSIS — I10 Essential (primary) hypertension: Secondary | ICD-10-CM

## 2014-08-29 DIAGNOSIS — M47896 Other spondylosis, lumbar region: Secondary | ICD-10-CM

## 2014-08-29 LAB — COMPREHENSIVE METABOLIC PANEL
ALK PHOS: 47 U/L (ref 39–117)
ALT: 26 U/L (ref 0–53)
AST: 25 U/L (ref 0–37)
Albumin: 4.1 g/dL (ref 3.5–5.2)
BUN: 16 mg/dL (ref 6–23)
CO2: 29 mEq/L (ref 19–32)
CREATININE: 1.08 mg/dL (ref 0.50–1.35)
Calcium: 9.5 mg/dL (ref 8.4–10.5)
Chloride: 102 mEq/L (ref 96–112)
Glucose, Bld: 103 mg/dL — ABNORMAL HIGH (ref 70–99)
Potassium: 4.2 mEq/L (ref 3.5–5.3)
SODIUM: 139 meq/L (ref 135–145)
TOTAL PROTEIN: 7.2 g/dL (ref 6.0–8.3)
Total Bilirubin: 1.3 mg/dL — ABNORMAL HIGH (ref 0.2–1.2)

## 2014-08-29 LAB — LIPID PANEL
CHOL/HDL RATIO: 2.1 ratio
Cholesterol: 157 mg/dL (ref 0–200)
HDL: 76 mg/dL (ref >39)
LDL Cholesterol: 73 mg/dL (ref 0–99)
TRIGLYCERIDES: 41 mg/dL (ref <150)
VLDL: 8 mg/dL (ref 0–40)

## 2014-08-29 MED ORDER — BACLOFEN 10 MG PO TABS: 10.0000 mg | ORAL_TABLET | Freq: Three times a day (TID) | ORAL | Status: DC

## 2014-08-29 NOTE — Progress Notes (Signed)
   Subjective:    Patient ID: Adam Vasquez, male    DOB: [birth year], 73 y.o.   MRN: [id number]  HPI Annual physical  Exercising 3 days does both aerobic and resistance.   Diet remains healthy. Up to date on HPDP stuff Fasting for today's bloodwork.    Review of Systems Having some stiffness/arthritis right hand Otherwise ROS neg     Objective:   Physical Exam HEENT normal Neck supple, nl thyroid Lungs clear Cardiac RRR without m or g Abd benign Ext normal Neuro normal        Assessment & Plan:

## 2014-08-29 NOTE — Assessment & Plan Note (Signed)
Great control. We ran 10 y risk 15.7%.  Offered statin (with some hesitation of my part) Politely decline.

## 2014-08-29 NOTE — Patient Instructions (Addendum)
I will call with blood work results and send letter. 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 Continue feeling grateful - we all should Tell [place] that I did a really thorough job of reviewing your health status.

## 2014-08-29 NOTE — Assessment & Plan Note (Signed)
Healthy male with no complaints or lifestyle issues.

## 2014-08-30 ENCOUNTER — Encounter: Payer: Self-pay | Admitting: Family Medicine

## 2014-08-30 NOTE — Assessment & Plan Note (Signed)
Well controled with OTC pain meds and an occasional muscle relaxant.  Will switch from flexeril to baclofen due to age.

## 2015-02-12 ENCOUNTER — Other Ambulatory Visit: Payer: Self-pay | Admitting: Family Medicine

## 2015-03-25 ENCOUNTER — Ambulatory Visit (INDEPENDENT_AMBULATORY_CARE_PROVIDER_SITE_OTHER): Payer: PPO | Admitting: Family Medicine

## 2015-03-25 ENCOUNTER — Encounter: Payer: Self-pay | Admitting: Family Medicine

## 2015-03-25 VITALS — BP 138/72 | HR 52 | Temp 98.2°F | Wt 203.0 lb

## 2015-03-25 DIAGNOSIS — W57XXXA Bitten or stung by nonvenomous insect and other nonvenomous arthropods, initial encounter: Secondary | ICD-10-CM

## 2015-03-25 DIAGNOSIS — R509 Fever, unspecified: Secondary | ICD-10-CM | POA: Diagnosis not present

## 2015-03-25 DIAGNOSIS — R21 Rash and other nonspecific skin eruption: Secondary | ICD-10-CM

## 2015-03-25 DIAGNOSIS — T148 Other injury of unspecified body region: Secondary | ICD-10-CM

## 2015-03-25 MED ORDER — DOXYCYCLINE HYCLATE 100 MG PO TABS: 100.0000 mg | ORAL_TABLET | Freq: Two times a day (BID) | ORAL | Status: DC

## 2015-03-25 NOTE — Patient Instructions (Signed)
Thank you for coming in,   I will send in doxycycline to be taken for 10 days. Be careful being out in the sun with docxycycline as it can make sunburns worse.   If you develop worsening of your symptoms please call the clinic.   Please bring all of your medications with you to each visit.    Please feel free to call with any questions or concerns at any time, at [phone]. --Dr. Dollene Primrose Bite Information Ticks are insects that attach themselves to the skin and draw blood for food. There are various types of ticks. Common types include wood ticks and deer ticks. Most ticks live in shrubs and grassy areas. Ticks can climb onto your body when you make contact with leaves or grass where the tick is waiting. The most common places on the body for ticks to attach themselves are the scalp, neck, armpits, waist, and groin. Most tick bites are harmless, but sometimes ticks carry germs that cause diseases. These germs can be spread to a person during the tick's feeding process. The chance of a disease spreading through a tick bite depends on:   The type of tick.  Time of year.   How long the tick is attached.   Geographic location.  HOW CAN YOU PREVENT TICK BITES? Take these steps to help prevent tick bites when you are outdoors:  Wear protective clothing. Long sleeves and long pants are best.   Wear white clothes so you can see ticks more easily.  Tuck your pant legs into your socks.   If walking on a trail, stay in the middle of the trail to avoid brushing against bushes.  Avoid walking through areas with long grass.  Put insect repellent on all exposed skin and along boot tops, pant legs, and sleeve cuffs.   Check clothing, hair, and skin repeatedly and before going inside.   Brush off any ticks that are not attached.  Take a shower or bath as soon as possible after being outdoors.  WHAT IS THE PROPER WAY TO REMOVE A TICK? Ticks should be removed as soon as possible  to help prevent diseases caused by tick bites. 1. If latex gloves are available, put them on before trying to remove a tick.  2. Using fine-point tweezers, grasp the tick as close to the skin as possible. You may also use curved forceps or a tick removal tool. Grasp the tick as close to its head as possible. Avoid grasping the tick on its body. 3. Pull gently with steady upward pressure until the tick lets go. Do not twist the tick or jerk it suddenly. This may break off the tick's head or mouth parts. 4. Do not squeeze or crush the tick's body. This could force disease-carrying fluids from the tick into your body.  5. After the tick is removed, wash the bite area and your hands with soap and water or other disinfectant such as alcohol. 6. Apply a small amount of antiseptic cream or ointment to the bite site.  7. Wash and disinfect any instruments that were used.  Do not try to remove a tick by applying a hot match, petroleum jelly, or fingernail polish to the tick. These methods do not work and may increase the chances of disease being spread from the tick bite.  WHEN SHOULD YOU SEEK MEDICAL CARE? Contact your health care provider if you are unable to remove a tick from your skin or if a part of the tick  breaks off and is stuck in the skin.  After a tick bite, you need to be aware of signs and symptoms that could be related to diseases spread by ticks. Contact your health care provider if you develop any of the following in the days or weeks after the tick bite:  Unexplained fever.  Rash. A circular rash that appears days or weeks after the tick bite may indicate the possibility of Lyme disease. The rash may resemble a target with a bull's-eye and may occur at a different part of your body than the tick bite.  Redness and swelling in the area of the tick bite.   Tender, swollen lymph glands.   Diarrhea.   Weight loss.   Cough.   Fatigue.   Muscle, joint, or bone pain.    Abdominal pain.   Headache.   Lethargy or a change in your level of consciousness.  Difficulty walking or moving your legs.   Numbness in the legs.   Paralysis.  Shortness of breath.   Confusion.   Repeated vomiting.  Document Released: 09/11/2000 Document Revised: 07/05/2013 Document Reviewed: 02/22/2013 [facility] Patient Information 2015 [place], [place]. This information is not intended to replace advice given to you by your health care provider. Make sure you discuss any questions you have with your health care provider.

## 2015-03-25 NOTE — Assessment & Plan Note (Signed)
Tick on upper left upper lateral thigh. Induration around tick. Tick was removed.  Due to constellation of symptoms will placed on empiric treatment  Doubtful for lyme. Possible for STARI.  - doxycycline 100 mg BID for 10 days.  - informed of red flags and need for f/u if symptoms worsen.

## 2015-03-25 NOTE — Progress Notes (Signed)
   Subjective:    Patient ID: Adam Vasquez, male    DOB: 01-Dec-1940, 74 y.o.   MRN: [id number]  Seen for Same day visit for   CC: Fevers, chills   Aches, fevers and chills since last Thursday. Fevers recorded at 100.8.  Symptoms are intermittent in nature. Having sweats. Last night was his sleep was the best it had been since last Thursday. Denies Nausea, vomting, constipation or dirrhea. No travel in the last 6 montsh and no food out of the ordinary.  He works outside often. His wife is not displaying any of the similar symptoms. Unsure of sick contacts.    Review of Systems   See HPI for ROS. Objective:  BP 165/88 mmHg  Pulse 52  Temp(Src) 98.2 F (36.8 C) (Oral)  Wt 203 lb (92.08 kg)  General: NAD Cardiac: RRR, normal heart sounds, no murmurs.  Respiratory: CTAB, normal effort Extremities: tick on right upper hip Skin: erythema and induration around tick attachment. No bullseye rash. Erythematous rash on right lower abdomen  Neuro: alert and oriented, no focal deficits     Assessment & Plan:  See Problem List Documentation

## 2015-04-10 ENCOUNTER — Encounter: Payer: Self-pay | Admitting: Family Medicine

## 2015-04-10 ENCOUNTER — Ambulatory Visit (INDEPENDENT_AMBULATORY_CARE_PROVIDER_SITE_OTHER): Payer: PPO | Admitting: Family Medicine

## 2015-04-10 VITALS — BP 125/75 | HR 55 | Temp 98.1°F | Wt 202.5 lb

## 2015-04-10 DIAGNOSIS — W57XXXA Bitten or stung by nonvenomous insect and other nonvenomous arthropods, initial encounter: Secondary | ICD-10-CM

## 2015-04-10 DIAGNOSIS — T148 Other injury of unspecified body region: Secondary | ICD-10-CM

## 2015-04-10 NOTE — Assessment & Plan Note (Addendum)
Patient s/p 10 day treatment with Doxycycline.   -Reassurance provided at today's appointment. -Reviewed with patient that symptoms can be present after completing abx -Return precautions discussed with patient, who voiced good understanding and appreciation for today's appointment -Encouraged rest and hydration -If symptoms persist, would consider longer treatment with Doxy -Patient to return in 2 weeks to follow up with PCP or sooner if symptoms worsen.

## 2015-04-10 NOTE — Patient Instructions (Addendum)
It was a pleasure seeing you today, Adam Vasquez.  Information regarding what we discussed is included in this packet.  It is normal to continue having some residual symptoms after treatment for a tickbite.  These include but are not limited to malaise, chills, joint aches.  However, if you start having fevers again, please seek medical attention sooner than your next appointment.  Please make an appointment to see your PCP in 2 weeks.  Please feel free to call our office at [phone] if any questions or concerns arise.  Warm Regards, Yaden Seith M. Dallan Schonberg, DO  Tick Bite Information Ticks are insects that attach themselves to the skin. There are many types of ticks. Common types include wood ticks and deer ticks. Sometimes, ticks carry diseases that can make a person very ill. The most common places for ticks to attach themselves are the scalp, neck, armpits, waist, and groin.  HOW CAN YOU PREVENT TICK BITES? Take these steps to help prevent tick bites when you are outdoors:  Wear long sleeves and long pants.  Wear white clothes so you can see ticks more easily.  Tuck your pant legs into your socks.  If walking on a trail, stay in the middle of the trail to avoid brushing against bushes.  Avoid walking through areas with long grass.  Put bug spray on all skin that is showing and along boot tops, pant legs, and sleeve cuffs.  Check clothes, hair, and skin often and before going inside.  Brush off any ticks that are not attached.  Take a shower or bath as soon as possible after being outdoors. HOW SHOULD YOU REMOVE A TICK? Ticks should be removed as soon as possible to help prevent diseases. 1. If latex gloves are available, put them on before trying to remove a tick. 2. Use tweezers to grasp the tick as close to the skin as possible. You may also use curved forceps or a tick removal tool. Grasp the tick as close to its head as possible. Avoid grasping the tick on its body. 3. Pull  gently upward until the tick lets go. Do not twist the tick or jerk it suddenly. This may break off the tick's head or mouth parts. 4. Do not squeeze or crush the tick's body. This could force disease-carrying fluids from the tick into your body. 5. After the tick is removed, wash the bite area and your hands with soap and water or alcohol. 6. Apply a small amount of antiseptic cream or ointment to the bite site. 7. Wash any tools that were used. Do not try to remove a tick by applying a hot match, petroleum jelly, or fingernail polish to the tick. These methods do not work. They may also increase the chances of disease being spread from the tick bite. WHEN SHOULD YOU SEEK HELP? Contact your health care provider if you are unable to remove a tick or if a part of the tick breaks off in the skin. After a tick bite, you need to watch for signs and symptoms of diseases that can be spread by ticks. Contact your health care provider if you develop any of the following:  Fever.  Rash.  Redness and puffiness (swelling) in the area of the tick bite.  Tender, puffy lymph glands.  Watery poop (diarrhea).  Weight loss.  Cough.  Feeling more tired than normal (fatigue).  Muscle, joint, or bone pain.  Belly (abdominal) pain.  Headache.  Change in your level of consciousness.  Trouble walking or moving your legs.  Loss of feeling (numbness) in the legs.  Loss of movement (paralysis).  Shortness of breath.  Confusion.  Throwing up (vomiting) many times. Document Released: 12/09/2009 Document Revised: 05/17/2013 Document Reviewed: 02/22/2013 [facility] Patient Information 2015 [place], [place]. This information is not intended to replace advice given to you by your health care provider. Make sure you discuss any questions you have with your health care provider.

## 2015-04-10 NOTE — Progress Notes (Signed)
Patient ID: Adam Vasquez, male   DOB: 1941/05/15, 74 y.o.   MRN: [id number]    Subjective: CC: tick bite HPI: Patient is a 74 y.o. male presenting to clinic today for same day appointment. Concerns today include:  1. Tick bite Patient reports that he was bitten by a tick ~16 days ago.  He denies fevers, arthalgia, myalgia, rash, nausea, vomiting.  Endorses chills and lightheadedness over last couple of days.  He reports that he was seen about 16 days ago and was prescribed Doxycycline, which he completed a 10 day course of.  He reports that he is concerned because he thinks that there is still a piece of the tick in his hip.  He reports that he is staying well hydrated at home.  He voices great concern over symptoms because he is normally very active and well feeling.  FamHx and MedHx reviewed.   ROS: All other systems reviewed and are negative.  Objective: Office vital signs reviewed. BP 125/75 mmHg  Pulse 55  Temp(Src) 98.1 F (36.7 C) (Oral)  Wt 202 lb 8 oz (91.853 kg)  Physical Examination:  General: Awake, alert, well nourished male, NAD HEENT: Normal, EOMI Cardio: pulse slightly brady Pulm: breathing normally on RA Extremities: WWP, No edema, cyanosis or clubbing MSK: Normal gait and station Skin: dry, intact, no rashes appreciated.  L hip with 0.5cm lesion with surrounding hyperpigmentation.  Non exudative, non bleeding, no erythema, no induration.  Small black object (62mm) imbedded in skin. Neuro: follows commands, no focal deficits  Verbal consent was obtained and what looked to be a tick vs scab was removed from patient's L hip using sterile pickups.  Area was cleaned with an alcohol swab.  No bleeding after removal.  Assessment: 74 y.o. male s/p tick bite with recent completion of 10 days of Doxycycline  Plan: See Problem List and After Visit Summary   Janora Norlander, DO PGY-2, [place]

## 2015-04-11 ENCOUNTER — Other Ambulatory Visit: Payer: Self-pay | Admitting: Family Medicine

## 2015-04-11 ENCOUNTER — Telehealth: Payer: Self-pay | Admitting: Family Medicine

## 2015-04-11 NOTE — Telephone Encounter (Signed)
Dr. Mancel Bale is calling because he would like to have a blood test to check for rocky mountain spotted fever. He was seen yesterday for symptoms however he would still like to be tested for reassurance. Please contact the patient as he would like to know if he should fast for these labs as well as to inform him that orders have been put in and he can make the appt. Thank you, Fonda Kinder, ASA

## 2015-04-12 ENCOUNTER — Other Ambulatory Visit: Payer: Self-pay | Admitting: Family Medicine

## 2015-04-12 ENCOUNTER — Other Ambulatory Visit: Payer: PPO

## 2015-04-12 DIAGNOSIS — W57XXXA Bitten or stung by nonvenomous insect and other nonvenomous arthropods, initial encounter: Secondary | ICD-10-CM

## 2015-04-12 NOTE — Telephone Encounter (Signed)
Pt informed.   He will be here at 3:30 this afternoon. Kelten Enochs, Salome Spotted, CMA

## 2015-04-12 NOTE — Telephone Encounter (Signed)
Will place orders for these this afternoon

## 2015-04-15 NOTE — Progress Notes (Signed)
Reviewed and agree.

## 2015-04-16 ENCOUNTER — Encounter: Payer: Self-pay | Admitting: Family Medicine

## 2015-04-16 ENCOUNTER — Telehealth: Payer: Self-pay | Admitting: Family Medicine

## 2015-04-16 LAB — LYME ABY, WSTRN BLT IGG & IGM W/BANDS
B burgdorferi IgG Abs (IB): NEGATIVE
B burgdorferi IgM Abs (IB): NEGATIVE
LYME DISEASE 18 KD IGG: NONREACTIVE
LYME DISEASE 28 KD IGG: NONREACTIVE
LYME DISEASE 93 KD IGG: NONREACTIVE
Lyme Disease 23 kD IgG: NONREACTIVE
Lyme Disease 23 kD IgM: NONREACTIVE
Lyme Disease 30 kD IgG: NONREACTIVE
Lyme Disease 39 kD IgG: NONREACTIVE
Lyme Disease 39 kD IgM: NONREACTIVE
Lyme Disease 41 kD IgG: NONREACTIVE
Lyme Disease 41 kD IgM: NONREACTIVE
Lyme Disease 45 kD IgG: NONREACTIVE
Lyme Disease 58 kD IgG: NONREACTIVE
Lyme Disease 66 kD IgG: NONREACTIVE

## 2015-04-16 LAB — ROCKY MTN SPOTTED FVR ABS PNL(IGG+IGM)
RMSF IGG: 9.35 IV — AB
RMSF IgM: 0.15 IV

## 2015-04-16 NOTE — Progress Notes (Signed)
Patient ID: Adam Vasquez, male   DOB: 07-Nov-1940, 74 y.o.   MRN: [id number] I called patient with results.  Sequence is: tick removed 6/27.  Received doxy.  Slow to recover - but did steadily recover from febrile illness.  Seen again 7/13 for possible retained tick part.  Labs ordered - timing is midway between acute and convalescent.  Because it is IGG elevation, less likely acute infection but still possible.  Patient feels fine at this point so no further antibiotics indicated.  Informed that he may have had RMSF.  He will call if fever recurs.  No further WU needed.

## 2015-04-16 NOTE — Telephone Encounter (Signed)
Spoke to patient regarding both lab results.

## 2015-04-16 NOTE — Telephone Encounter (Signed)
Returning Dr. Marjean Donna call re: he lab results

## 2015-08-16 ENCOUNTER — Other Ambulatory Visit: Payer: Self-pay | Admitting: Family Medicine

## 2015-08-19 NOTE — Telephone Encounter (Signed)
Patient called needing a 90 day refill.  He is leaving town in the AM.  Please advise.  Derl Barrow, RN

## 2015-08-28 ENCOUNTER — Encounter: Payer: Self-pay | Admitting: Family Medicine

## 2015-08-28 ENCOUNTER — Ambulatory Visit (INDEPENDENT_AMBULATORY_CARE_PROVIDER_SITE_OTHER): Payer: PPO | Admitting: Family Medicine

## 2015-08-28 ENCOUNTER — Ambulatory Visit (HOSPITAL_COMMUNITY)
Admission: RE | Admit: 2015-08-28 | Discharge: 2015-08-28 | Disposition: A | Discharge status: Home / Self Care | Payer: PPO | Source: Ambulatory Visit

## 2015-08-28 ENCOUNTER — Ambulatory Visit (HOSPITAL_COMMUNITY)
Admission: RE | Admit: 2015-08-28 | Discharge: 2015-08-28 | Disposition: A | Discharge status: Home / Self Care | Payer: PPO | Source: Ambulatory Visit | Attending: Family Medicine | Admitting: Family Medicine

## 2015-08-28 VITALS — BP 138/88 | HR 50 | Temp 97.6°F | Ht 70.0 in | Wt 202.0 lb

## 2015-08-28 DIAGNOSIS — I10 Essential (primary) hypertension: Secondary | ICD-10-CM | POA: Insufficient documentation

## 2015-08-28 DIAGNOSIS — Z0000 Encounter for general adult medical examination without abnormal findings: Secondary | ICD-10-CM | POA: Diagnosis not present

## 2015-08-28 DIAGNOSIS — R7303 Prediabetes: Secondary | ICD-10-CM | POA: Insufficient documentation

## 2015-08-28 DIAGNOSIS — R739 Hyperglycemia, unspecified: Secondary | ICD-10-CM

## 2015-08-28 DIAGNOSIS — N529 Male erectile dysfunction, unspecified: Secondary | ICD-10-CM

## 2015-08-28 DIAGNOSIS — R001 Bradycardia, unspecified: Secondary | ICD-10-CM | POA: Insufficient documentation

## 2015-08-28 DIAGNOSIS — R9431 Abnormal electrocardiogram [ECG] [EKG]: Secondary | ICD-10-CM | POA: Insufficient documentation

## 2015-08-28 HISTORY — DX: Male erectile dysfunction, unspecified: N52.9

## 2015-08-28 LAB — POCT GLYCOSYLATED HEMOGLOBIN (HGB A1C): Hemoglobin A1C: 5.4

## 2015-08-28 MED ORDER — TADALAFIL 2.5 MG PO TABS: 2.5000 mg | ORAL_TABLET | Freq: Every day | ORAL | Status: DC

## 2015-08-28 NOTE — Patient Instructions (Signed)
You are in great shape. Come in for fasting lab work any time after 12/2.  (I ordered CMP, lipid panel and CBC.) I sent the prescription.  Call me when you are due for a refill so we can make any necessary adjustments.  You might want to check with the insurance company about cost or alternative meds.

## 2015-08-28 NOTE — Progress Notes (Signed)
   Subjective:    Patient ID: QUANTAE PORTER, male    DOB: 1941/04/18, 74 y.o.   MRN: [id number]  HPI In for his annual preventive med exam and follow up of problems.  Issues: 1.Continues on BP meds without complaints.  Also continues active exercise and healthy diet.  Never had EKG 2. C/O erectile dysfunction.  Wants to discuss options. 3. Up to date on health maint.  Lipid panel after 08/30/15. 4. FU + for DM and MI.  Last lipid panel great.  On ASA.  Not on statin but last lipid panel great.  Has history of minimally elevated.   5. No at risk behavior.  Sees ophthalmology annually.   ROS.  No chest pain, SOB, bleeding, worrisome skin lesions, falls or depression.  No change in bowel, bladder weight or appetite.  No polyuria, polydipsea or polyphagia     Review of Systems     Objective:   Physical Exam BP is borderline controled. Lungs clear Cardiac RRR without m or g Abd benign Ext no edema normal pulses Neuro, gait normal, motor and sensory grossly intact.         Assessment & Plan:

## 2015-08-28 NOTE — Assessment & Plan Note (Signed)
Checked A1C due to minimally elevated FBS and strong FHx of DM.  A1C is in fully normal range.

## 2015-08-28 NOTE — Assessment & Plan Note (Signed)
Controlled, barely.  Monitor and continue same RX.

## 2015-08-28 NOTE — Assessment & Plan Note (Signed)
EKG HR=41.  Asymptomatic.  No syncope.  Not on any meds that would slow rate.  No work up for now.

## 2015-08-28 NOTE — Assessment & Plan Note (Signed)
Very healthy male with no at risk habits or behaviors.  Good compliance with treatments.

## 2015-08-28 NOTE — Assessment & Plan Note (Signed)
Trial of daily cialis

## 2015-09-02 ENCOUNTER — Telehealth: Payer: Self-pay | Admitting: *Deleted

## 2015-09-02 NOTE — Telephone Encounter (Signed)
Form completed.

## 2015-09-02 NOTE — Telephone Encounter (Signed)
PA form faxed to [facility] for review.  Derl Barrow, RN

## 2015-09-02 NOTE — Telephone Encounter (Signed)
Prior Authorization received from [place] for Cialis 2.5 mg.  PA form placed in provider box for completion. Derl Barrow, RN

## 2015-09-03 NOTE — Telephone Encounter (Signed)
PA was denied from [facility] under Medicare Part D.  Erectile dysfunction will meet the definition of a Part D drug when prescribed for medically-accepted indications approved by the FDA other than sexual or erectile dysfunction.  Medication could be approved if prescribe for pulmonary hypertension or BPH.  Derl Barrow, RN

## 2015-09-09 ENCOUNTER — Telehealth: Payer: Self-pay | Admitting: Family Medicine

## 2015-09-09 DIAGNOSIS — N529 Male erectile dysfunction, unspecified: Secondary | ICD-10-CM

## 2015-09-09 NOTE — Telephone Encounter (Signed)
Pt is returning Dr. Hensel's call. jw °

## 2015-09-10 MED ORDER — TADALAFIL 2.5 MG PO TABS: 2.5000 mg | ORAL_TABLET | Freq: Every day | ORAL | Status: DC

## 2015-09-10 MED ORDER — SILDENAFIL CITRATE 50 MG PO TABS: 50.0000 mg | ORAL_TABLET | Freq: Every day | ORAL | Status: DC | PRN

## 2015-09-10 NOTE — Telephone Encounter (Signed)
Spoke with Dr. Mancel Bale.  We tried cialis for daily use and his insurance would not pay.  We came up with a two pronged plan. 1. We will try prn Viagra.  Start at 50 mg dose due to age.  Assume insurance will cover this. 2. He would like a written Rx for the cialis for daily use to try to fill through a [facility].    He is very aware to not take both meds at the same time.  For now, I will keep both on his med list.  He will decide which is affordable and feasible and chose the best option.

## 2015-09-13 ENCOUNTER — Other Ambulatory Visit: Payer: PPO

## 2015-09-13 DIAGNOSIS — I10 Essential (primary) hypertension: Secondary | ICD-10-CM

## 2015-09-13 LAB — CBC
HEMATOCRIT: 39.9 % (ref 39.0–52.0)
HEMOGLOBIN: 13 g/dL (ref 13.0–17.0)
MCH: 29.6 pg (ref 26.0–34.0)
MCHC: 32.6 g/dL (ref 30.0–36.0)
MCV: 90.9 fL (ref 78.0–100.0)
MPV: 10.3 fL (ref 8.6–12.4)
Platelets: 239 10*3/uL (ref 150–400)
RBC: 4.39 MIL/uL (ref 4.22–5.81)
RDW: 12.8 % (ref 11.5–15.5)
WBC: 4 10*3/uL (ref 4.0–10.5)

## 2015-09-13 LAB — COMPREHENSIVE METABOLIC PANEL
ALT: 26 U/L (ref 9–46)
AST: 29 U/L (ref 10–35)
Albumin: 4.1 g/dL (ref 3.6–5.1)
Alkaline Phosphatase: 42 U/L (ref 40–115)
BUN: 16 mg/dL (ref 7–25)
CHLORIDE: 101 mmol/L (ref 98–110)
CO2: 29 mmol/L (ref 20–31)
Calcium: 9.2 mg/dL (ref 8.6–10.3)
Creat: 0.96 mg/dL (ref 0.70–1.18)
Glucose, Bld: 108 mg/dL — ABNORMAL HIGH (ref 65–99)
POTASSIUM: 4.4 mmol/L (ref 3.5–5.3)
Sodium: 138 mmol/L (ref 135–146)
TOTAL PROTEIN: 7.2 g/dL (ref 6.1–8.1)
Total Bilirubin: 1.2 mg/dL (ref 0.2–1.2)

## 2015-09-13 LAB — LIPID PANEL
CHOL/HDL RATIO: 1.7 ratio (ref <=5.0)
Cholesterol: 164 mg/dL (ref 125–200)
HDL: 94 mg/dL (ref >=40)
LDL CALC: 63 mg/dL (ref <130)
TRIGLYCERIDES: 36 mg/dL (ref <150)
VLDL: 7 mg/dL (ref <30)

## 2015-09-13 NOTE — Progress Notes (Signed)
Labs done today marci holder 

## 2015-09-16 ENCOUNTER — Encounter: Payer: Self-pay | Admitting: Family Medicine

## 2015-09-18 ENCOUNTER — Telehealth: Payer: Self-pay | Admitting: *Deleted

## 2015-09-18 NOTE — Telephone Encounter (Signed)
Received a fax from [facility] requesting a change in Rx Viagra 50 mg to Sildenafil 20 mg.  Rx for Sildenafil 20 mg tablets taken 2-3 tabs PRN would cost $93 for 30 compared to $559.58 for 10 tablets of Viagra.  Please advise.  Derl Barrow, RN

## 2015-09-19 NOTE — Telephone Encounter (Signed)
Dear Dema Severin Team I am sending this to you and Dr. Andria Frames who is outthis wek. I am not sure if Dr. Andria Frames would want to make that change so you will have to let him decide when he comes back. It is not a one to one change. Please letthe patient know this and then route to Dr. Lowella Bandy box. THANKS! Dorcas Mcmurray

## 2015-09-20 NOTE — Telephone Encounter (Signed)
Spoke to pt. He understands and will wait for Dr. Andria Frames to call next week. Ottis Stain, CMA

## 2015-09-24 ENCOUNTER — Other Ambulatory Visit: Payer: Self-pay | Admitting: Family Medicine

## 2015-09-24 MED ORDER — SILDENAFIL CITRATE 20 MG PO TABS: ORAL_TABLET | ORAL | Status: DC

## 2015-10-21 ENCOUNTER — Encounter: Payer: Self-pay | Admitting: Family Medicine

## 2015-10-21 DIAGNOSIS — R739 Hyperglycemia, unspecified: Secondary | ICD-10-CM

## 2016-02-04 ENCOUNTER — Other Ambulatory Visit: Payer: Self-pay | Admitting: Gastroenterology

## 2016-02-04 DIAGNOSIS — K621 Rectal polyp: Secondary | ICD-10-CM | POA: Diagnosis not present

## 2016-02-04 DIAGNOSIS — Z1211 Encounter for screening for malignant neoplasm of colon: Secondary | ICD-10-CM | POA: Diagnosis not present

## 2016-02-04 DIAGNOSIS — K640 First degree hemorrhoids: Secondary | ICD-10-CM | POA: Diagnosis not present

## 2016-02-21 ENCOUNTER — Ambulatory Visit
Admission: RE | Admit: 2016-02-21 | Discharge: 2016-02-21 | Disposition: A | Discharge status: Home / Self Care | Payer: PPO | Source: Ambulatory Visit | Attending: Family Medicine | Admitting: Family Medicine

## 2016-02-21 ENCOUNTER — Ambulatory Visit (INDEPENDENT_AMBULATORY_CARE_PROVIDER_SITE_OTHER): Payer: PPO | Admitting: Family Medicine

## 2016-02-21 ENCOUNTER — Encounter: Payer: Self-pay | Admitting: Family Medicine

## 2016-02-21 ENCOUNTER — Telehealth: Payer: Self-pay | Admitting: Family Medicine

## 2016-02-21 VITALS — BP 156/79 | HR 57 | Temp 98.1°F | Wt 204.0 lb

## 2016-02-21 DIAGNOSIS — L089 Local infection of the skin and subcutaneous tissue, unspecified: Secondary | ICD-10-CM | POA: Diagnosis not present

## 2016-02-21 DIAGNOSIS — S90851A Superficial foreign body, right foot, initial encounter: Secondary | ICD-10-CM | POA: Diagnosis not present

## 2016-02-21 DIAGNOSIS — S99921A Unspecified injury of right foot, initial encounter: Secondary | ICD-10-CM | POA: Diagnosis not present

## 2016-02-21 MED ORDER — DOXYCYCLINE HYCLATE 100 MG PO TABS: 100.0000 mg | ORAL_TABLET | Freq: Two times a day (BID) | ORAL | Status: DC

## 2016-02-21 NOTE — Assessment & Plan Note (Signed)
Small amount of purulence coming from wound concerning for possible retained foreign body or superinfection Opened and irrigated today, without foreign body found Will obtain xray to ensure glass does not remain in the foot Treat with doxycycline to cover staph and strep Holding on pseudomonal coverage at this time given that he is well appearing, the wound is mild, and no diabetes Low threshold to add cipro should he clinically worsen or fail to improve.

## 2016-02-21 NOTE — Telephone Encounter (Signed)
Attempted to reach patient to let him know that no foreign body was seen on his xray This is good news. It doesn't totally rule out there being glass in his foot, but makes it less likely.  No answer, LVM asking him to return the call Please inform him of above message when he calls back  Leeanne Rio, MD

## 2016-02-21 NOTE — Patient Instructions (Signed)
Go get xray of your foot If there is a foreign body in it, we may need you to see an orthopedic specialist  Sent in doxycycline - twice a day for 7 days If worsening redness, pain, pus, etc please seek care immediately this weekend (ED or urgent care) Avoid sun exposure or wear sunscreen while on doxycycline  Be well, Dr. Ardelia Mems

## 2016-02-21 NOTE — Progress Notes (Signed)
Date of Visit: 02/21/2016   HPI:  Patient presents for a same day appointment to discuss glass in foot.  About 1 week ago he stepped on some glass in his living room. Was not wearing shoes or socks. He pulled a piece of glass out then. Has had some discomfort in that area since then. This morning he squeezed the area and some pus came out, so he was worried that there is still a piece of glass in his foot.  Denies history of diabetes. Records reviewed - last Tdap November 2012, less than 5 years ago.   ROS: See HPI  [place]: history of hypertension, allergic rhinitis, arthritis, sinus bradycardia  PHYSICAL EXAM: BP 156/79 mmHg  Pulse 57  Temp(Src) 98.1 F (36.7 C) (Oral)  Wt 204 lb (92.534 kg) Gen: NAD, pleasant, cooperative Extremities: plantar surface of R foot with small 1cm area of discoloration with central 40mm of prior puncture. Squeezing releases small amount of purulent material. No surrounding erythema. No fluctuance.  PROCEDURE NOTE:  After informed consent obtained verbally and in writing, the skin of the R plantar foot was cleansed with an alcohol swab. Area anesthetized using local anesthesia with less than 1cc of 1% lidocaine without epinephrine. Area cleaned using betadine swabs x3. Small L-shaped 0.3cm incision made into the skin using an 11 blade. Very tiny amount of purulence expressed with manual palpation. No foreign bodies noted. A plastic IV catheter was then attached to a syringe filled with sterile saline, and inserted into the wound. Wound copiously irrigated with 20cc of saline x2. Area cleaned and dried, and gauze dressing applied.   ASSESSMENT/PLAN:  Foreign body in foot, right, infected Small amount of purulence coming from wound concerning for possible retained foreign body or superinfection Opened and irrigated today, without foreign body found Will obtain xray to ensure glass does not remain in the foot Treat with doxycycline to cover staph and  strep Holding on pseudomonal coverage at this time given that he is well appearing, the wound is mild, and no diabetes Low threshold to add cipro should he clinically worsen or fail to improve.    FOLLOW UP: Follow up as needed if symptoms worsen or fail to improve.    [place]. Ardelia Mems, [place]

## 2016-02-25 ENCOUNTER — Other Ambulatory Visit: Payer: Self-pay | Admitting: Family Medicine

## 2016-08-14 DIAGNOSIS — H43813 Vitreous degeneration, bilateral: Secondary | ICD-10-CM | POA: Diagnosis not present

## 2016-08-14 DIAGNOSIS — H25813 Combined forms of age-related cataract, bilateral: Secondary | ICD-10-CM | POA: Diagnosis not present

## 2016-08-14 DIAGNOSIS — H40013 Open angle with borderline findings, low risk, bilateral: Secondary | ICD-10-CM | POA: Diagnosis not present

## 2016-08-26 ENCOUNTER — Other Ambulatory Visit: Payer: Self-pay | Admitting: Family Medicine

## 2016-09-03 ENCOUNTER — Ambulatory Visit (INDEPENDENT_AMBULATORY_CARE_PROVIDER_SITE_OTHER): Payer: PPO | Admitting: Family Medicine

## 2016-09-03 ENCOUNTER — Encounter: Payer: Self-pay | Admitting: Family Medicine

## 2016-09-03 DIAGNOSIS — I10 Essential (primary) hypertension: Secondary | ICD-10-CM

## 2016-09-03 DIAGNOSIS — N528 Other male erectile dysfunction: Secondary | ICD-10-CM

## 2016-09-03 DIAGNOSIS — R001 Bradycardia, unspecified: Secondary | ICD-10-CM | POA: Diagnosis not present

## 2016-09-03 DIAGNOSIS — Z0000 Encounter for general adult medical examination without abnormal findings: Secondary | ICD-10-CM | POA: Diagnosis not present

## 2016-09-03 DIAGNOSIS — M1712 Unilateral primary osteoarthritis, left knee: Secondary | ICD-10-CM | POA: Insufficient documentation

## 2016-09-03 DIAGNOSIS — M1732 Unilateral post-traumatic osteoarthritis, left knee: Secondary | ICD-10-CM

## 2016-09-03 MED ORDER — SILDENAFIL CITRATE 20 MG PO TABS: 20.0000 mg | ORAL_TABLET | Freq: Every day | ORAL | 12 refills | Status: DC | PRN

## 2016-09-03 MED ORDER — DICLOFENAC SODIUM 1 % TD GEL: 2.0000 g | Freq: Four times a day (QID) | TRANSDERMAL | 12 refills | Status: DC
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
## 2016-09-03 NOTE — Assessment & Plan Note (Signed)
Healthy male with good lifestyle.

## 2016-09-03 NOTE — Patient Instructions (Addendum)
I will enter future lab orders.  Have the blood drawn anytime after 09/12/16 You look marvelous.   Sorry about Ivory's brother.   Please check your blood pressure about once a month to make sure it is controled. Remember to find the sweet spot with exercise and that left knee. See me in one year if things are going well.

## 2016-09-03 NOTE — Assessment & Plan Note (Signed)
Diclofenac gel per patient request.  It has worked in the past.

## 2016-09-03 NOTE — Assessment & Plan Note (Signed)
Check TSH.  On no meds causing brady.  Asymptomatic Observe.

## 2016-09-03 NOTE — Assessment & Plan Note (Signed)
Good control on current meds and healthy lifestyle.

## 2016-09-03 NOTE — Assessment & Plan Note (Signed)
Refill cilalis

## 2016-09-15 ENCOUNTER — Other Ambulatory Visit: Payer: PPO

## 2016-09-15 ENCOUNTER — Other Ambulatory Visit: Payer: Self-pay | Admitting: Family Medicine

## 2016-09-15 DIAGNOSIS — R001 Bradycardia, unspecified: Secondary | ICD-10-CM | POA: Diagnosis not present

## 2016-09-15 DIAGNOSIS — I10 Essential (primary) hypertension: Secondary | ICD-10-CM | POA: Diagnosis not present

## 2016-09-15 LAB — LIPID PANEL
CHOLESTEROL: 160 mg/dL (ref <200)
HDL: 83 mg/dL (ref >40)
LDL CALC: 69 mg/dL (ref <100)
TRIGLYCERIDES: 39 mg/dL (ref <150)
Total CHOL/HDL Ratio: 1.9 Ratio (ref <5.0)
VLDL: 8 mg/dL (ref <30)

## 2016-09-15 LAB — CBC
HEMATOCRIT: 41.6 % (ref 38.5–50.0)
HEMOGLOBIN: 12.9 g/dL — AB (ref 13.2–17.1)
MCH: 29 pg (ref 27.0–33.0)
MCHC: 31 g/dL — ABNORMAL LOW (ref 32.0–36.0)
MCV: 93.5 fL (ref 80.0–100.0)
MPV: 10.4 fL (ref 7.5–12.5)
Platelets: 244 10*3/uL (ref 140–400)
RBC: 4.45 MIL/uL (ref 4.20–5.80)
RDW: 13.4 % (ref 11.0–15.0)
WBC: 3.9 10*3/uL (ref 3.8–10.8)

## 2016-09-15 LAB — COMPLETE METABOLIC PANEL WITH GFR
ALT: 20 U/L (ref 9–46)
AST: 24 U/L (ref 10–35)
Albumin: 4.4 g/dL (ref 3.6–5.1)
Alkaline Phosphatase: 39 U/L — ABNORMAL LOW (ref 40–115)
BILIRUBIN TOTAL: 1 mg/dL (ref 0.2–1.2)
BUN: 20 mg/dL (ref 7–25)
CALCIUM: 9.1 mg/dL (ref 8.6–10.3)
CO2: 29 mmol/L (ref 20–31)
CREATININE: 1.18 mg/dL (ref 0.70–1.18)
Chloride: 102 mmol/L (ref 98–110)
GFR, EST AFRICAN AMERICAN: 69 mL/min (ref >=60)
GFR, Est Non African American: 60 mL/min (ref >=60)
Glucose, Bld: 111 mg/dL — ABNORMAL HIGH (ref 65–99)
Potassium: 4.4 mmol/L (ref 3.5–5.3)
Sodium: 140 mmol/L (ref 135–146)
TOTAL PROTEIN: 7.6 g/dL (ref 6.1–8.1)

## 2016-09-15 LAB — TSH: TSH: 2.53 mIU/L (ref 0.40–4.50)

## 2016-09-16 ENCOUNTER — Encounter: Payer: Self-pay | Admitting: Family Medicine

## 2016-09-16 LAB — FERRITIN: Ferritin: 90 ng/mL (ref 20–380)

## 2016-12-28 DIAGNOSIS — M25552 Pain in left hip: Secondary | ICD-10-CM | POA: Diagnosis not present

## 2017-01-04 DIAGNOSIS — M25552 Pain in left hip: Secondary | ICD-10-CM | POA: Diagnosis not present

## 2017-01-11 DIAGNOSIS — M25552 Pain in left hip: Secondary | ICD-10-CM | POA: Diagnosis not present

## 2017-01-18 DIAGNOSIS — M25552 Pain in left hip: Secondary | ICD-10-CM | POA: Diagnosis not present

## 2017-02-03 DIAGNOSIS — M25552 Pain in left hip: Secondary | ICD-10-CM | POA: Diagnosis not present

## 2017-02-26 DIAGNOSIS — M25552 Pain in left hip: Secondary | ICD-10-CM | POA: Diagnosis not present

## 2017-03-08 ENCOUNTER — Other Ambulatory Visit: Payer: Self-pay | Admitting: Family Medicine

## 2017-08-11 ENCOUNTER — Encounter: Payer: Self-pay | Admitting: Family Medicine

## 2017-08-11 ENCOUNTER — Ambulatory Visit: Payer: PPO | Admitting: Family Medicine

## 2017-08-11 DIAGNOSIS — I10 Essential (primary) hypertension: Secondary | ICD-10-CM | POA: Diagnosis not present

## 2017-08-11 DIAGNOSIS — M25552 Pain in left hip: Secondary | ICD-10-CM | POA: Diagnosis not present

## 2017-08-11 NOTE — Patient Instructions (Addendum)
I ordered a left hip x ray on you.  I will call with results. My last blood work on you was 09/15/16.  [facility] and we'll see what you still need.  It is easy for me to put in orders anytime.

## 2017-08-12 ENCOUNTER — Encounter: Payer: Self-pay | Admitting: Family Medicine

## 2017-08-12 DIAGNOSIS — M1612 Unilateral primary osteoarthritis, left hip: Secondary | ICD-10-CM | POA: Insufficient documentation

## 2017-08-12 NOTE — Assessment & Plan Note (Signed)
Likely osteoarthritis.  Will start WU with plain films.

## 2017-08-12 NOTE — Assessment & Plan Note (Signed)
Good control on current meds.  Future labs entered.

## 2017-08-12 NOTE — Progress Notes (Signed)
   Subjective:    Patient ID: Adam Vasquez, male    DOB: Dec 13, 1940, 76 y.o.   MRN: [id number]  HPI Left hip pain.  No acute injury.  Present x 3 months.  Slowly progressive.  Has worked with PT and has hit a plateau in [place].   2. Hypertension.  No complaints on current meds.  Did have a scare with his wife who was apparently overmedicated.  Reinforced his desire to have limited meds. 3. Received flu shot elsewhere.   4. My last labs were ~ 1 year ago.  [facility]    Review of Systems     Objective:   Physical Exam Lungs clear Cardiac RRR without m or g Abd benign Left hip.  Good ROM.  Mild pain on internal rotation.          Assessment & Plan:

## 2017-08-13 ENCOUNTER — Ambulatory Visit
Admission: RE | Admit: 2017-08-13 | Discharge: 2017-08-13 | Disposition: A | Discharge status: Home / Self Care | Payer: PPO | Source: Ambulatory Visit | Attending: Family Medicine | Admitting: Family Medicine

## 2017-08-13 DIAGNOSIS — M25552 Pain in left hip: Secondary | ICD-10-CM | POA: Diagnosis not present

## 2017-08-17 DIAGNOSIS — H25813 Combined forms of age-related cataract, bilateral: Secondary | ICD-10-CM | POA: Diagnosis not present

## 2017-08-17 DIAGNOSIS — H40013 Open angle with borderline findings, low risk, bilateral: Secondary | ICD-10-CM | POA: Diagnosis not present

## 2017-08-17 DIAGNOSIS — H43813 Vitreous degeneration, bilateral: Secondary | ICD-10-CM | POA: Diagnosis not present

## 2017-08-18 ENCOUNTER — Ambulatory Visit: Payer: PPO | Admitting: Family Medicine

## 2017-08-25 ENCOUNTER — Encounter: Payer: Self-pay | Admitting: Family Medicine

## 2017-08-25 NOTE — Progress Notes (Signed)
Outside labs from [place] all drawn 02/17/17 CBC, nl CMP normal HDL=100LDL=51 Vit D normal.   Will not scan these results.

## 2017-09-08 ENCOUNTER — Other Ambulatory Visit: Payer: Self-pay | Admitting: Family Medicine

## 2017-10-29 ENCOUNTER — Encounter: Payer: Self-pay | Admitting: Family Medicine

## 2018-03-11 DIAGNOSIS — I10 Essential (primary) hypertension: Secondary | ICD-10-CM | POA: Diagnosis not present

## 2018-03-11 DIAGNOSIS — Z6830 Body mass index (BMI) 30.0-30.9, adult: Secondary | ICD-10-CM | POA: Diagnosis not present

## 2018-03-11 DIAGNOSIS — Z87891 Personal history of nicotine dependence: Secondary | ICD-10-CM | POA: Diagnosis not present

## 2018-03-11 DIAGNOSIS — Z833 Family history of diabetes mellitus: Secondary | ICD-10-CM | POA: Diagnosis not present

## 2018-03-11 DIAGNOSIS — E669 Obesity, unspecified: Secondary | ICD-10-CM | POA: Diagnosis not present

## 2018-03-11 DIAGNOSIS — Z8249 Family history of ischemic heart disease and other diseases of the circulatory system: Secondary | ICD-10-CM | POA: Diagnosis not present

## 2018-03-11 DIAGNOSIS — N529 Male erectile dysfunction, unspecified: Secondary | ICD-10-CM | POA: Diagnosis not present

## 2018-05-23 ENCOUNTER — Other Ambulatory Visit: Payer: Self-pay | Admitting: Family Medicine

## 2018-06-28 DIAGNOSIS — R69 Illness, unspecified: Secondary | ICD-10-CM | POA: Diagnosis not present

## 2018-07-25 ENCOUNTER — Encounter: Payer: PPO | Admitting: Family Medicine

## 2018-07-25 ENCOUNTER — Ambulatory Visit (INDEPENDENT_AMBULATORY_CARE_PROVIDER_SITE_OTHER): Payer: Medicare HMO | Admitting: Family Medicine

## 2018-07-25 ENCOUNTER — Encounter: Payer: Self-pay | Admitting: Family Medicine

## 2018-07-25 ENCOUNTER — Other Ambulatory Visit: Payer: Self-pay

## 2018-07-25 DIAGNOSIS — I10 Essential (primary) hypertension: Secondary | ICD-10-CM

## 2018-07-25 DIAGNOSIS — Z0000 Encounter for general adult medical examination without abnormal findings: Secondary | ICD-10-CM | POA: Diagnosis not present

## 2018-07-25 MED ORDER — ASPIRIN 81 MG PO TABS: 81.0000 mg | ORAL_TABLET | Freq: Every day | ORAL | Status: DC

## 2018-07-25 NOTE — Patient Instructions (Addendum)
For now, avoid ranitidine/xantac.  There have not been any warnings about Tagement/cimetidine.  OK to use it.   The new guidelines say to stop aspirin for primary prevention at age 77.  As we discussed, with your 1/5 ten year risk, I am fine with you staying on the aspirin for another year.  Let's remember to talk again next year. I don't think we need any blood tests at this time. Watch for any infection where I removed the skin tag. I hope Ivory's feet heal up well. See me in one year.  Sooner if you have problems.

## 2018-07-26 ENCOUNTER — Encounter: Payer: Self-pay | Admitting: Family Medicine

## 2018-07-26 NOTE — Progress Notes (Signed)
   Subjective:    Patient ID: Adam Vasquez, male    DOB: 05/13/1941, 76 y.o.   MRN: [id number]  HPI  Annual exam.  Also receives considerable care from the [place].  I have just gotten a packet of their latest eval, reviewed and placed in the to be scanned pile.  That information is not yet in media. No complaints.  Continues to be happy and well.  Wife of 83 years is facing some medical issues.  Of course, he is concerned.  Issues: 1. HBP well controled on current meds.  Occ checks at home and home readings have been good. 2. ASCVD risk.  Patient is now 63 and still primary prevention.  We discussed newest recommendation to DC ASA at age 69.  Calculated using old lipid panel.  Risk was 21% but ideal risk was 18%  He is not interested in statins and has chosen to continue ASA. 3. Cancer screening up to date 4. Immunizations up to date. 5. Lifestyle: Fit and healthy.  Good BMI.  Does both aerobic and resistance training .  Healthy diet.  Manages stress well (psychologist) 6. Intermitant right shoulder discomfort.  Good ROM.  No neck problems.  Denies weakness or numbness.   7. Skin lesion left thigh wants removed 8. Takes prn OTC ranitidine.  Heard recent manufacture problems.   Review of Systems No CP, DOE, leg swelling, HA, focal neuro sx. No change in weight, appetite, bowel, or bladder.     Objective:   Physical Exam VS and weight noted HEENT WNL Neck supple without masses Lungs clear Cardiac RRR without m or G Abd benign Ext no edema.  Single skin tag, inner aspect of left thigh removed. Shoulders FROM without creptis or weakness. Neuro Motor, sensory and cognition grossly intact.       Assessment & Plan:

## 2018-07-26 NOTE — Assessment & Plan Note (Signed)
Very healthy male with no bad habits or at risk behaviors.

## 2018-07-26 NOTE — Assessment & Plan Note (Signed)
Well controled on current meds 

## 2018-08-07 IMAGING — CR DG HIP (WITH OR WITHOUT PELVIS) 2-3V*L*
2 series · 2 of 2 positions shown · non-contrast
Comparison: None.

CLINICAL DATA: Chronic left hip pain without known injury.

EXAM:
DG HIP (WITH OR WITHOUT PELVIS) 2-3V LEFT

[t hip ap left]
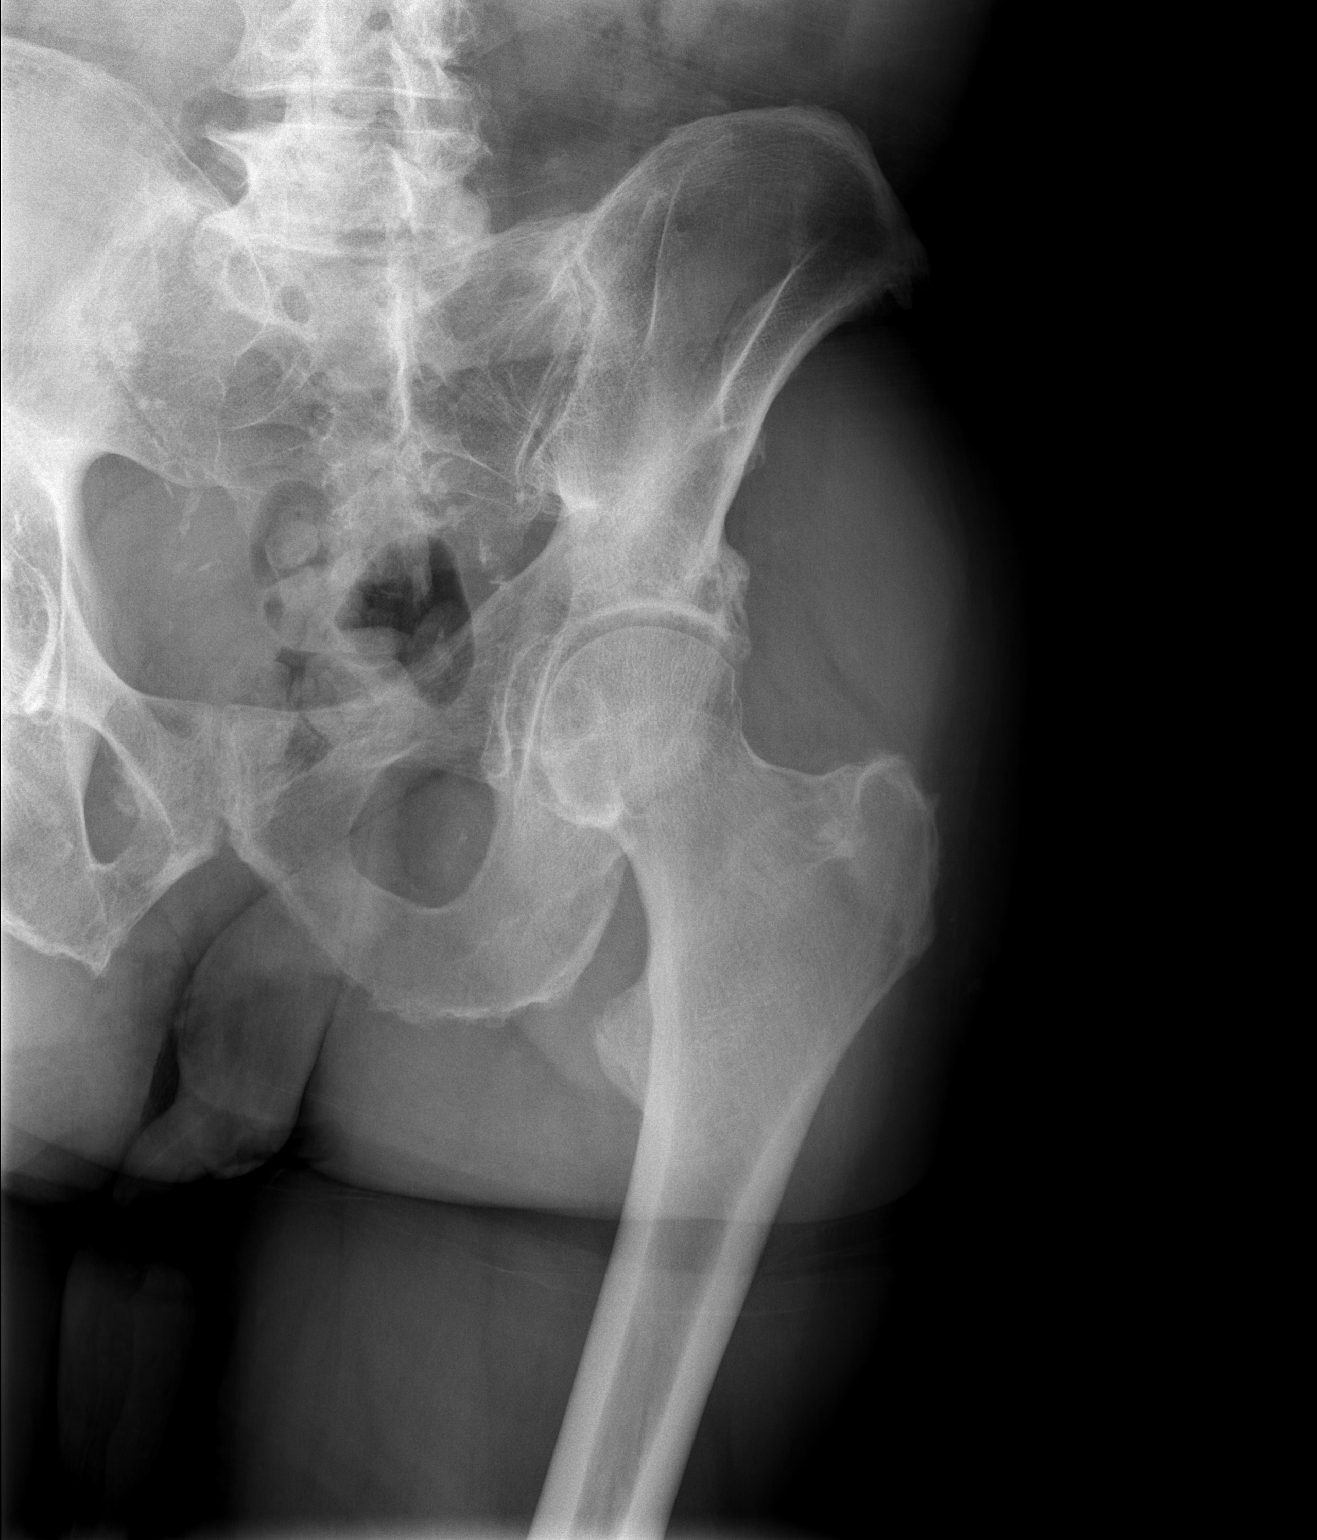

[t hip frog leg left]
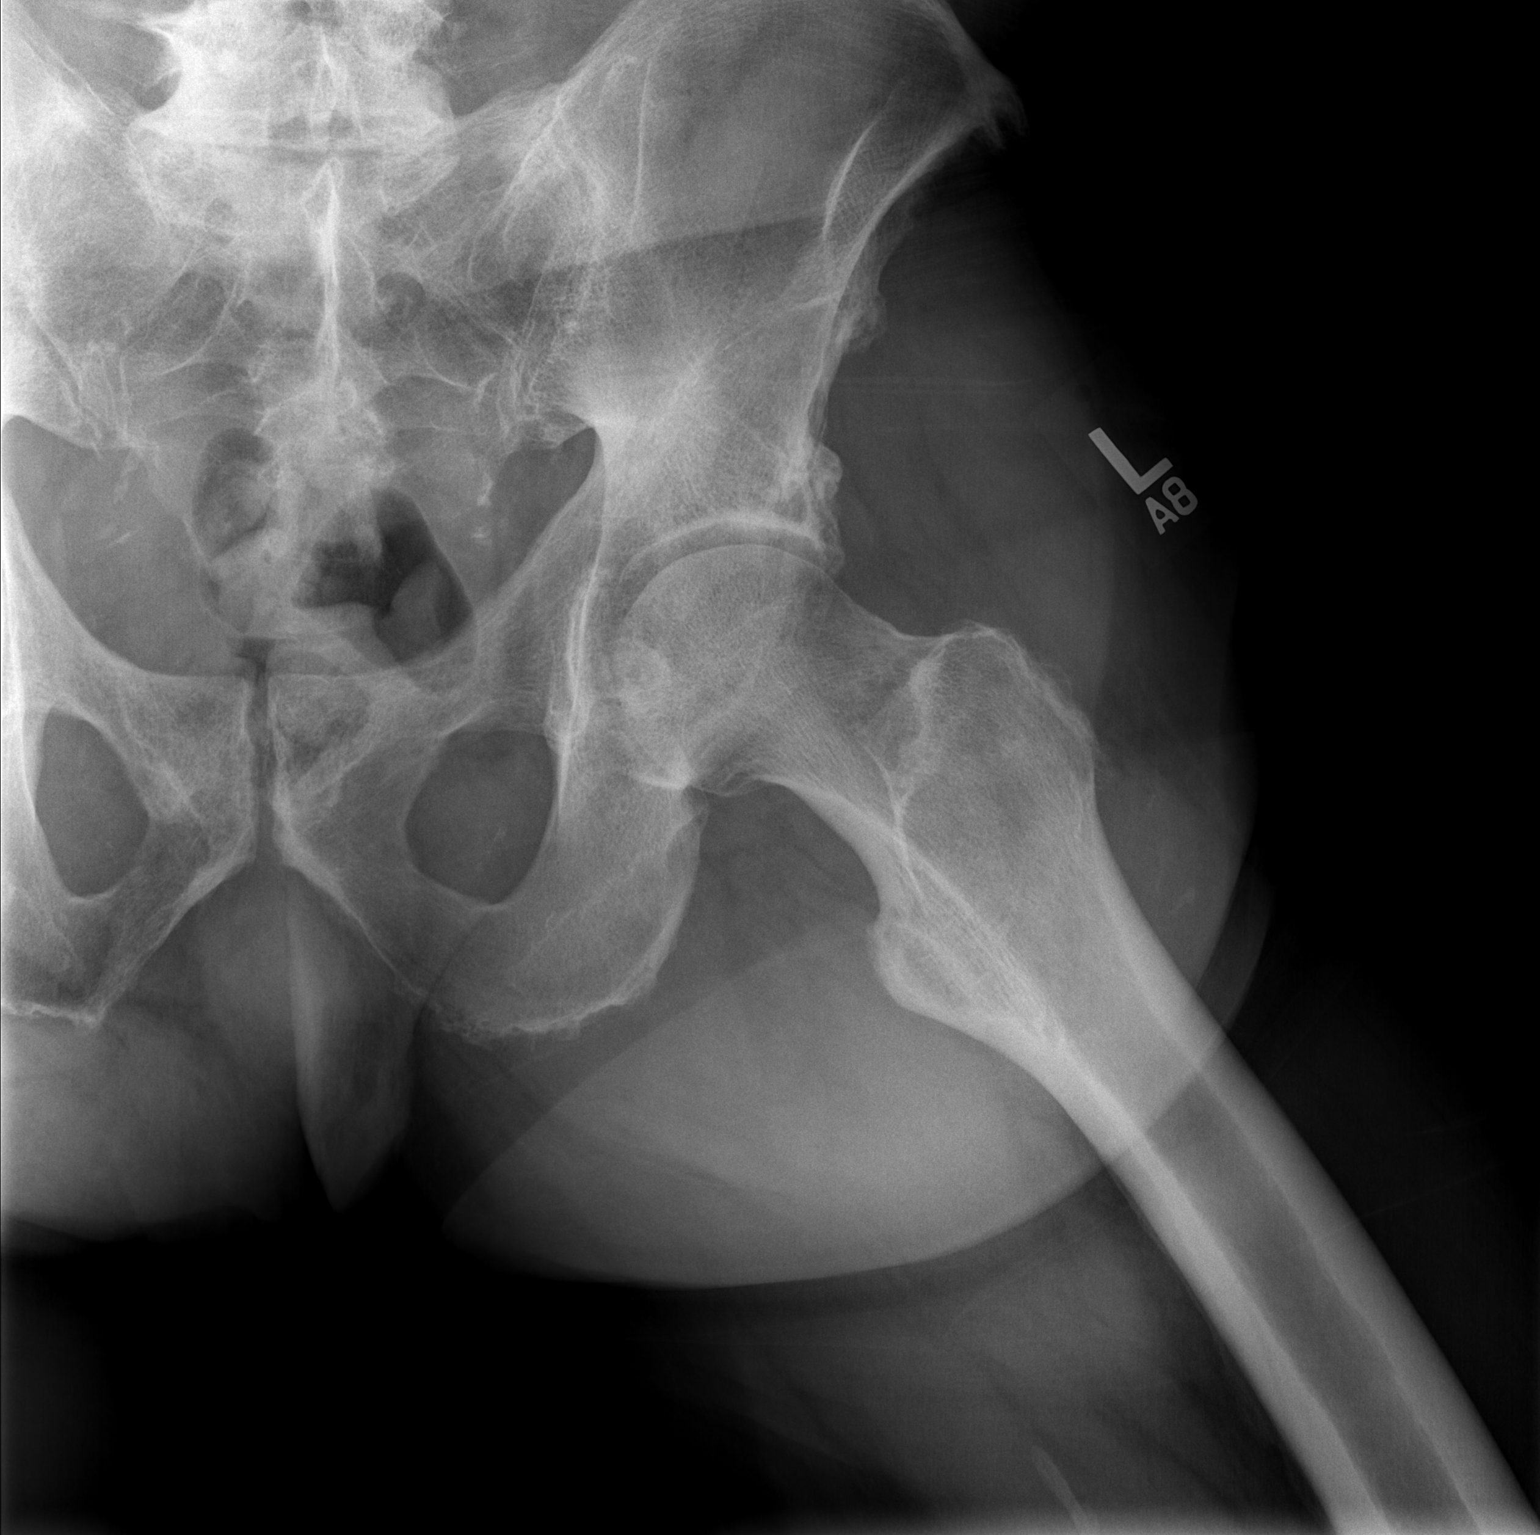

[2 of 2 positions shown; findings below may reference images not displayed]

FINDINGS: There is no evidence of hip fracture or dislocation. There is no
evidence of arthropathy or other focal bone abnormality.
IMPRESSION: Normal left hip.

## 2018-08-17 DIAGNOSIS — H40013 Open angle with borderline findings, low risk, bilateral: Secondary | ICD-10-CM | POA: Diagnosis not present

## 2018-08-17 DIAGNOSIS — H25813 Combined forms of age-related cataract, bilateral: Secondary | ICD-10-CM | POA: Diagnosis not present

## 2018-08-17 DIAGNOSIS — H43813 Vitreous degeneration, bilateral: Secondary | ICD-10-CM | POA: Diagnosis not present

## 2018-09-15 ENCOUNTER — Other Ambulatory Visit: Payer: Self-pay | Admitting: Family Medicine

## 2018-10-13 ENCOUNTER — Encounter: Payer: Self-pay | Admitting: Family Medicine

## 2018-10-13 ENCOUNTER — Ambulatory Visit: Payer: Medicare HMO

## 2018-10-13 ENCOUNTER — Other Ambulatory Visit: Payer: Self-pay

## 2018-10-13 ENCOUNTER — Ambulatory Visit (INDEPENDENT_AMBULATORY_CARE_PROVIDER_SITE_OTHER): Payer: Medicare HMO | Admitting: Family Medicine

## 2018-10-13 VITALS — BP 120/70 | HR 48 | Temp 97.8°F | Wt 202.2 lb

## 2018-10-13 DIAGNOSIS — H9311 Tinnitus, right ear: Secondary | ICD-10-CM

## 2018-10-14 ENCOUNTER — Encounter: Payer: Self-pay | Admitting: Family Medicine

## 2018-10-14 DIAGNOSIS — H9311 Tinnitus, right ear: Secondary | ICD-10-CM

## 2018-10-14 HISTORY — DX: Tinnitus, right ear: H93.11

## 2018-10-14 NOTE — Assessment & Plan Note (Addendum)
Patient presents with over an episode of ringing in the right ear lasting a few minutes and resolving with use of mineral oil.  No prior history.  This morning patient is asymptomatic.  No red flags concerning for vestibular condition or possible mass such as vertigo, dizziness, nausea vomiting, headaches, vision change night sweats or weight loss.  Exam was normal except for cerumen impaction bilaterally.  Ears were cleaned in clinic.  Patient will continue to monitor symptoms.  Explained to patient this could be secondary to high-frequency losses experienced as we get older known as presbycusis.  He will follow-up with PCP as needed if symptoms recur.

## 2018-10-14 NOTE — Progress Notes (Signed)
   Subjective:    Patient ID: Adam Vasquez, male    DOB: 08/18/1941, 78 y.o.   MRN: [id number]   CC: Ringing in right ear  HPI: Patient is a 78 year old male presents today complaining of ringing in his right ear yesterday night.  Patient reports that he was awakened in the middle of the night with ringing in his ear.  Patient reports that he was frightened.  He denies any similar episode prior.  Patient reports that he uses mineral oil in both ears with quick resolution of his ringing.  This morning patient denies any ringing in his ear.  Patient feels well with no acute complaints.  Patient denies any headache, nausea, vomiting, dizziness, gait imbalance, vertigo, vision change, night sweats weight loss.  Smoking status reviewed   ROS: all other systems were reviewed and are negative other than in the HPI   Past Medical History:  Diagnosis Date  . Allergy   . Hypertension     No past surgical history on file.  Past medical history, surgical, family, and social history reviewed and updated in the EMR as appropriate.  Objective:  BP 120/70   Pulse (!) 48   Temp 97.8 F (36.6 C) (Oral)   Wt 202 lb 4 oz (91.7 kg)   SpO2 96%   BMI 29.02 kg/m   Vitals and nursing note reviewed  General: NAD, pleasant, able to participate in exam HEENT: Right tympanic membrane normal-appearing no bulging or erythema or fluid noted canal appear normal.  Moderate cerumen impaction.  No decreasing hearing.  Left ear exam was normal except for moderate cerumen impaction. Cardiac: RRR, normal heart sounds, no murmurs. 2+ radial and PT pulses bilaterally Respiratory: CTAB, normal effort, No wheezes, rales or rhonchi Abdomen: soft, nontender, nondistended, no hepatic or splenomegaly, +BS Extremities: no edema or cyanosis. WWP. Skin: warm and dry, no rashes noted Neuro: alert and oriented x4, no focal deficits Psych: Normal affect and mood   Assessment & Plan:   Ringing in the ears,  right Patient presents with over an episode of ringing in the right ear lasting a few minutes and resolving with use of mineral oil.  No prior history.  This morning patient is asymptomatic.  No red flags concerning for vestibular condition or possible mass such as vertigo, dizziness, nausea vomiting, headaches, vision change night sweats or weight loss.  Exam was normal except for cerumen impaction bilaterally.  Ears were cleaned in clinic.  Patient will continue to monitor symptoms.  Explained to patient this could be secondary to high-frequency losses experience as we get older.  He will follow-up with PCP as needed    Marjie Skiff, MD [place] PGY-3

## 2019-06-11 ENCOUNTER — Other Ambulatory Visit: Payer: Self-pay | Admitting: Family Medicine

## 2019-06-13 DIAGNOSIS — R69 Illness, unspecified: Secondary | ICD-10-CM | POA: Diagnosis not present

## 2019-07-27 ENCOUNTER — Ambulatory Visit (INDEPENDENT_AMBULATORY_CARE_PROVIDER_SITE_OTHER): Payer: Medicare HMO | Admitting: Family Medicine

## 2019-07-27 ENCOUNTER — Other Ambulatory Visit: Payer: Self-pay

## 2019-07-27 ENCOUNTER — Encounter: Payer: Self-pay | Admitting: Family Medicine

## 2019-07-27 VITALS — BP 120/80 | HR 49 | Ht 70.0 in | Wt 196.4 lb

## 2019-07-27 DIAGNOSIS — N528 Other male erectile dysfunction: Secondary | ICD-10-CM

## 2019-07-27 DIAGNOSIS — R739 Hyperglycemia, unspecified: Secondary | ICD-10-CM | POA: Diagnosis not present

## 2019-07-27 DIAGNOSIS — I10 Essential (primary) hypertension: Secondary | ICD-10-CM | POA: Diagnosis not present

## 2019-07-27 DIAGNOSIS — Z0000 Encounter for general adult medical examination without abnormal findings: Secondary | ICD-10-CM

## 2019-07-27 LAB — POCT GLYCOSYLATED HEMOGLOBIN (HGB A1C): Hemoglobin A1C: 5.5 % (ref 4.0–5.6)

## 2019-07-27 MED ORDER — HYDROCHLOROTHIAZIDE 25 MG PO TABS: 25.0000 mg | ORAL_TABLET | Freq: Every day | ORAL | 3 refills | Status: DC

## 2019-07-27 MED ORDER — LISINOPRIL 20 MG PO TABS: 20.0000 mg | ORAL_TABLET | Freq: Two times a day (BID) | ORAL | 3 refills | Status: DC

## 2019-07-27 MED ORDER — SILDENAFIL CITRATE 20 MG PO TABS: 20.0000 mg | ORAL_TABLET | Freq: Every day | ORAL | 12 refills | Status: DC | PRN

## 2019-07-27 NOTE — Progress Notes (Signed)
Date of Visit: 07/27/2019   HPI:  Patient presents today for a well adult male exam.   Concerns today: Patient tripped and fell on right shoulder while moving boxes a few weeks ago. Notes mild shoulder discomfort. States that pain is improving.   Sexual activity: yes, 1 partner (wife)  STD Screening: has not received and is open to being screened today   Exercise: regularly, 3x per week with rapid walking, 3.5 miles per session  Diet: well-balanced mediterranean diet with fruits, vegetables, nuts; drinks mostly water and has reduced coffee intake in order to cut down on milk and sugar  Smoking: former smoker (quit 40+ years ago)  Alcohol: drinks wine with meals (few times per month) Drugs: none  Advance directives: yes  Mood: reports doing "fine", financially secure but troubled by other people's circumstances during pandemic    ROS: See HPI  [place]: hypertension (taking medication: HCTZ, lisinopril)   Social History: Retired but works part-time as a Optometrist. Spends free time doing yard work and remains active.   Cancers in family: none   Additional Family History:  Mother: diabetes, hypertension, MI Father: diabetes, hypertension, MI   PHYSICAL EXAM: BP 120/80   Pulse (!) 49   Ht 5\' 10"  (1.778 m)   Wt 196 lb 6 oz (89.1 kg)   SpO2 98%   BMI 28.18 kg/m  Gen: NAD, pleasant, cooperative HEENT: NCAT, PERRL, cervical lymphadenopathy noted  Heart: RRR, no murmurs Lungs: CTAB, NWOB Abdomen: soft, nontender to palpation Neuro: grossly nonfocal, speech normal  ASSESSMENT/PLAN:  Shoulder Pain: Patient notes mild right shoulder discomfort following a recent fall. No treatment required as pain is improving on its own. Will continue to monitor.   # Health maintenance:  -STD screening: not required -immunizations: not due for any vaccines  -lipid screening: will receive during today's encounter  -colonoscopy: not due, last colonoscopy was in 2017  -advance directives: has  completed -handout given on health maintenance topics  FOLLOW UP: Follow up in 1 year for routine well adult exam    Gwynneth Albright, Medical Student

## 2019-07-27 NOTE — Patient Instructions (Addendum)
Thank you for coming in today! I am so glad to see that you are doing well!  A refill for your medications has been sent to your pharmacy. I have also provided a written prescription for your sildenafil. Please let us know if you have any concerns with your medications.   I will review your lab work. I will follow-up on the results and provide you with a copy to send to the [facility] in [place].   Our next follow-up will be in 1 year for a routine well adult male exam. Keep up the great work!   If you have any concerns, please call us at [phone].   [place]

## 2019-07-28 ENCOUNTER — Encounter: Payer: Self-pay | Admitting: Family Medicine

## 2019-07-28 LAB — CMP14+EGFR
ALT: 21 IU/L (ref 0–44)
AST: 21 IU/L (ref 0–40)
Albumin/Globulin Ratio: 1.5 (ref 1.2–2.2)
Albumin: 4.6 g/dL (ref 3.7–4.7)
Alkaline Phosphatase: 55 IU/L (ref 39–117)
BUN/Creatinine Ratio: 15 (ref 10–24)
BUN: 17 mg/dL (ref 8–27)
Bilirubin Total: 0.9 mg/dL (ref 0.0–1.2)
CO2: 24 mmol/L (ref 20–29)
Calcium: 9.4 mg/dL (ref 8.6–10.2)
Chloride: 101 mmol/L (ref 96–106)
Creatinine, Ser: 1.11 mg/dL (ref 0.76–1.27)
GFR calc Af Amer: 73 mL/min/{1.73_m2} (ref >59)
GFR calc non Af Amer: 63 mL/min/{1.73_m2} (ref >59)
Globulin, Total: 3 g/dL (ref 1.5–4.5)
Glucose: 100 mg/dL — ABNORMAL HIGH (ref 65–99)
Potassium: 4 mmol/L (ref 3.5–5.2)
Sodium: 139 mmol/L (ref 134–144)
Total Protein: 7.6 g/dL (ref 6.0–8.5)

## 2019-07-28 LAB — CBC
Hematocrit: 38.3 % (ref 37.5–51.0)
Hemoglobin: 12.5 g/dL — ABNORMAL LOW (ref 13.0–17.7)
MCH: 29.1 pg (ref 26.6–33.0)
MCHC: 32.6 g/dL (ref 31.5–35.7)
MCV: 89 fL (ref 79–97)
Platelets: 220 10*3/uL (ref 150–450)
RBC: 4.29 x10E6/uL (ref 4.14–5.80)
RDW: 11.7 % (ref 11.6–15.4)
WBC: 3.7 10*3/uL (ref 3.4–10.8)

## 2019-07-28 LAB — LIPID PANEL
Chol/HDL Ratio: 2 ratio (ref 0.0–5.0)
Cholesterol, Total: 166 mg/dL (ref 100–199)
HDL: 84 mg/dL (ref >39)
LDL Chol Calc (NIH): 74 mg/dL (ref 0–99)
Triglycerides: 37 mg/dL (ref 0–149)
VLDL Cholesterol Cal: 8 mg/dL (ref 5–40)

## 2019-07-28 NOTE — Assessment & Plan Note (Signed)
Very healthy male with good diet and exercise.  No at risk behaviors.

## 2019-10-26 ENCOUNTER — Ambulatory Visit: Payer: Medicare HMO

## 2019-11-04 ENCOUNTER — Ambulatory Visit: Payer: Medicare HMO

## 2020-02-27 DIAGNOSIS — Z833 Family history of diabetes mellitus: Secondary | ICD-10-CM | POA: Diagnosis not present

## 2020-02-27 DIAGNOSIS — I10 Essential (primary) hypertension: Secondary | ICD-10-CM | POA: Diagnosis not present

## 2020-02-27 DIAGNOSIS — N529 Male erectile dysfunction, unspecified: Secondary | ICD-10-CM | POA: Diagnosis not present

## 2020-02-27 DIAGNOSIS — Z87891 Personal history of nicotine dependence: Secondary | ICD-10-CM | POA: Diagnosis not present

## 2020-02-27 DIAGNOSIS — Z8249 Family history of ischemic heart disease and other diseases of the circulatory system: Secondary | ICD-10-CM | POA: Diagnosis not present

## 2020-04-29 ENCOUNTER — Telehealth: Payer: Self-pay | Admitting: Family Medicine

## 2020-04-29 DIAGNOSIS — N529 Male erectile dysfunction, unspecified: Secondary | ICD-10-CM

## 2020-04-29 NOTE — Telephone Encounter (Signed)
Patient is calling needing a referral to Urologist. If you have any questions please contact patient. [phone]. Thanks

## 2020-04-29 NOTE — Telephone Encounter (Signed)
Called: Want referral for erectile dysfunction.

## 2020-06-11 DIAGNOSIS — N5201 Erectile dysfunction due to arterial insufficiency: Secondary | ICD-10-CM | POA: Diagnosis not present

## 2020-06-24 DIAGNOSIS — R69 Illness, unspecified: Secondary | ICD-10-CM | POA: Diagnosis not present

## 2020-08-19 ENCOUNTER — Encounter: Payer: Self-pay | Admitting: Family Medicine

## 2020-08-19 ENCOUNTER — Other Ambulatory Visit: Payer: Self-pay

## 2020-08-19 ENCOUNTER — Ambulatory Visit (INDEPENDENT_AMBULATORY_CARE_PROVIDER_SITE_OTHER): Payer: Medicare HMO | Admitting: Family Medicine

## 2020-08-19 DIAGNOSIS — Z0000 Encounter for general adult medical examination without abnormal findings: Secondary | ICD-10-CM | POA: Diagnosis not present

## 2020-08-19 DIAGNOSIS — I10 Essential (primary) hypertension: Secondary | ICD-10-CM

## 2020-08-19 DIAGNOSIS — R011 Cardiac murmur, unspecified: Secondary | ICD-10-CM | POA: Insufficient documentation

## 2020-08-19 DIAGNOSIS — N529 Male erectile dysfunction, unspecified: Secondary | ICD-10-CM | POA: Diagnosis not present

## 2020-08-19 DIAGNOSIS — Z1159 Encounter for screening for other viral diseases: Secondary | ICD-10-CM | POA: Diagnosis not present

## 2020-08-19 DIAGNOSIS — Z125 Encounter for screening for malignant neoplasm of prostate: Secondary | ICD-10-CM | POA: Insufficient documentation

## 2020-08-19 DIAGNOSIS — R739 Hyperglycemia, unspecified: Secondary | ICD-10-CM

## 2020-08-19 HISTORY — DX: Cardiac murmur, unspecified: R01.1

## 2020-08-19 LAB — POCT GLYCOSYLATED HEMOGLOBIN (HGB A1C): Hemoglobin A1C: 5.5 % (ref 4.0–5.6)

## 2020-08-19 NOTE — Assessment & Plan Note (Signed)
Good pulses, doubt AS.  Will get echo to evaluate new m

## 2020-08-19 NOTE — Patient Instructions (Addendum)
I will call and send a letter with your lab test results. Someone should call to set up an echocardiogram.  You have a new, very soft, systolic ejection heart murmur.  The echocardiogram is mostly to look at your aortic valve.  Stay health and enjoy [place] with the family.

## 2020-08-19 NOTE — Assessment & Plan Note (Signed)
Well controled on current meds 

## 2020-08-19 NOTE — Assessment & Plan Note (Signed)
Screen x 1 

## 2020-08-19 NOTE — Progress Notes (Signed)
    SUBJECTIVE:   CHIEF COMPLAINT / HPI:   Annual physical. 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  He has no active complaints.    Chronic problems: 1. Hypertension.  No CP or DOE.  Good diet.  Lots of aerobic exercise.  States good compliance with meds. 2. ED.  Seeing urology.  He has done poorly with viagra/cialis.  Now using a vaccuum pump. 3. Emotional - he has excellent mental health.  He is concerned about the declining health of his spouse of 67 years.  She is blind, had a fall and now has impaired mobility.  They are going to [place] for Thanksgiving, which will be an ambitious trip for her.    HPDP: Up to date on all screening tests except never screened for hep C  PERTINENT  PMH / PSH: Denies CP, DOE, Abd pain, ankle swelling or focal neuro sx.  Denies change in appetite, bowel, bladder, or weight.  OBJECTIVE:   BP 138/74   Ht 5\' 10"  (1.778 m)   Wt 198 lb (89.8 kg)   SpO2 98%   BMI 28.41 kg/m   HEENT WNL Neck supple Lungs clear Cardiac 1/6 SEM new Abd benign Ext WNL Neuro, motor, sensory, gait, cognition and mood all grossly intact.  ASSESSMENT/PLAN:   Routine general medical examination at a health care facility Healthy male with no at risk behaviors.    Encounter for hepatitis C screening test for low risk patient Screen x 1  Newly recognized heart murmur Good pulses, doubt AS.  Will get echo to evaluate new m  Hyperglycemia A1C=5.5 today.  No DM  HYPERTENSION, BENIGN SYSTEMIC Well controled on current meds  Erectile dysfunction Per urology     Zenia Resides, MD [place]

## 2020-08-19 NOTE — Assessment & Plan Note (Signed)
Per u rology 

## 2020-08-19 NOTE — Addendum Note (Signed)
Addended by: Valerie Roys on: 08/19/2020 04:54 PM   Modules accepted: Orders

## 2020-08-19 NOTE — Assessment & Plan Note (Signed)
A1C=5.5 today.  No DM

## 2020-08-19 NOTE — Assessment & Plan Note (Signed)
Healthy male with no at risk behaviors. 

## 2020-08-20 ENCOUNTER — Encounter: Payer: Self-pay | Admitting: Family Medicine

## 2020-08-20 LAB — CMP14+EGFR
ALT: 22 IU/L (ref 0–44)
AST: 21 IU/L (ref 0–40)
Albumin/Globulin Ratio: 1.3 (ref 1.2–2.2)
Albumin: 4.7 g/dL (ref 3.7–4.7)
Alkaline Phosphatase: 63 IU/L (ref 44–121)
BUN/Creatinine Ratio: 15 (ref 10–24)
BUN: 16 mg/dL (ref 8–27)
Bilirubin Total: 1.1 mg/dL (ref 0.0–1.2)
CO2: 25 mmol/L (ref 20–29)
Calcium: 9.7 mg/dL (ref 8.6–10.2)
Chloride: 102 mmol/L (ref 96–106)
Creatinine, Ser: 1.06 mg/dL (ref 0.76–1.27)
GFR calc Af Amer: 77 mL/min/{1.73_m2} (ref >59)
GFR calc non Af Amer: 66 mL/min/{1.73_m2} (ref >59)
Globulin, Total: 3.6 g/dL (ref 1.5–4.5)
Glucose: 107 mg/dL — ABNORMAL HIGH (ref 65–99)
Potassium: 3.9 mmol/L (ref 3.5–5.2)
Sodium: 141 mmol/L (ref 134–144)
Total Protein: 8.3 g/dL (ref 6.0–8.5)

## 2020-08-20 LAB — LIPID PANEL
Chol/HDL Ratio: 2.2 ratio (ref 0.0–5.0)
Cholesterol, Total: 180 mg/dL (ref 100–199)
HDL: 83 mg/dL (ref >39)
LDL Chol Calc (NIH): 89 mg/dL (ref 0–99)
Triglycerides: 39 mg/dL (ref 0–149)
VLDL Cholesterol Cal: 8 mg/dL (ref 5–40)

## 2020-08-20 LAB — HEPATITIS C ANTIBODY: Hep C Virus Ab: 0.1 s/co ratio (ref 0.0–0.9)

## 2020-08-20 LAB — CBC
Hematocrit: 39 % (ref 37.5–51.0)
Hemoglobin: 12.6 g/dL — ABNORMAL LOW (ref 13.0–17.7)
MCH: 29.4 pg (ref 26.6–33.0)
MCHC: 32.3 g/dL (ref 31.5–35.7)
MCV: 91 fL (ref 79–97)
Platelets: 230 10*3/uL (ref 150–450)
RBC: 4.29 x10E6/uL (ref 4.14–5.80)
RDW: 12.1 % (ref 11.6–15.4)
WBC: 3.8 10*3/uL (ref 3.4–10.8)

## 2020-09-12 ENCOUNTER — Other Ambulatory Visit: Payer: Self-pay | Admitting: Family Medicine

## 2020-09-12 DIAGNOSIS — I10 Essential (primary) hypertension: Secondary | ICD-10-CM

## 2020-09-18 ENCOUNTER — Other Ambulatory Visit: Payer: Self-pay

## 2020-09-18 ENCOUNTER — Ambulatory Visit (HOSPITAL_COMMUNITY): Payer: Medicare HMO | Attending: Cardiology

## 2020-09-18 DIAGNOSIS — R011 Cardiac murmur, unspecified: Secondary | ICD-10-CM | POA: Insufficient documentation

## 2020-09-18 LAB — ECHOCARDIOGRAM COMPLETE
Area-P 1/2: 2.35 cm2
S' Lateral: 2.7 cm

## 2020-10-23 DIAGNOSIS — N5201 Erectile dysfunction due to arterial insufficiency: Secondary | ICD-10-CM | POA: Diagnosis not present

## 2020-12-14 ENCOUNTER — Other Ambulatory Visit: Payer: Self-pay | Admitting: Family Medicine

## 2020-12-14 DIAGNOSIS — I10 Essential (primary) hypertension: Secondary | ICD-10-CM

## 2020-12-16 ENCOUNTER — Telehealth: Payer: Self-pay | Admitting: Family Medicine

## 2020-12-16 DIAGNOSIS — R778 Other specified abnormalities of plasma proteins: Secondary | ICD-10-CM | POA: Insufficient documentation

## 2020-12-16 NOTE — Assessment & Plan Note (Signed)
High total protein, low normal albumen.  Will check SPEP

## 2020-12-16 NOTE — Telephone Encounter (Signed)
He provided me [facility] of [facility].  Multiple minor abnormalities.  Called patient and the one I want to follow up on is the high protein, low normal albumen.  He will come in for labs

## 2020-12-19 ENCOUNTER — Other Ambulatory Visit: Payer: Self-pay

## 2020-12-19 ENCOUNTER — Other Ambulatory Visit: Payer: Medicare HMO

## 2020-12-19 DIAGNOSIS — R778 Other specified abnormalities of plasma proteins: Secondary | ICD-10-CM | POA: Diagnosis not present

## 2020-12-24 ENCOUNTER — Encounter: Payer: Self-pay | Admitting: Family Medicine

## 2020-12-24 LAB — PROTEIN ELECTROPHORESIS, SERUM
A/G Ratio: 1.1 (ref 0.7–1.7)
Albumin ELP: 3.9 g/dL (ref 2.9–4.4)
Alpha 1: 0.2 g/dL (ref 0.0–0.4)
Alpha 2: 0.6 g/dL (ref 0.4–1.0)
Beta: 1 g/dL (ref 0.7–1.3)
Gamma Globulin: 1.6 g/dL (ref 0.4–1.8)
Globulin, Total: 3.5 g/dL (ref 2.2–3.9)
Total Protein: 7.4 g/dL (ref 6.0–8.5)

## 2021-03-18 ENCOUNTER — Encounter: Payer: Self-pay | Admitting: Endocrinology

## 2021-03-18 ENCOUNTER — Ambulatory Visit (INDEPENDENT_AMBULATORY_CARE_PROVIDER_SITE_OTHER): Payer: Medicare HMO | Admitting: Endocrinology

## 2021-03-18 ENCOUNTER — Other Ambulatory Visit: Payer: Self-pay

## 2021-03-18 VITALS — Ht 70.0 in | Wt 199.0 lb

## 2021-03-18 DIAGNOSIS — Z125 Encounter for screening for malignant neoplasm of prostate: Secondary | ICD-10-CM

## 2021-03-18 DIAGNOSIS — N529 Male erectile dysfunction, unspecified: Secondary | ICD-10-CM

## 2021-03-18 DIAGNOSIS — R7989 Other specified abnormal findings of blood chemistry: Secondary | ICD-10-CM | POA: Diagnosis not present

## 2021-03-18 HISTORY — DX: Other specified abnormal findings of blood chemistry: R79.89

## 2021-03-18 LAB — PSA: PSA: 0.36 ng/mL (ref 0.10–4.00)

## 2021-03-18 LAB — LUTEINIZING HORMONE: LH: 20.16 m[IU]/mL (ref 3.10–34.60)

## 2021-03-18 LAB — BASIC METABOLIC PANEL
BUN: 16 mg/dL (ref 6–23)
CO2: 29 mEq/L (ref 19–32)
Calcium: 9.2 mg/dL (ref 8.4–10.5)
Chloride: 101 mEq/L (ref 96–112)
Creatinine, Ser: 1 mg/dL (ref 0.40–1.50)
GFR: 71.51 mL/min (ref >60.00)
Glucose, Bld: 112 mg/dL — ABNORMAL HIGH (ref 70–99)
Potassium: 4 mEq/L (ref 3.5–5.1)
Sodium: 138 mEq/L (ref 135–145)

## 2021-03-18 LAB — T4, FREE: Free T4: 0.88 ng/dL (ref 0.60–1.60)

## 2021-03-18 LAB — TSH: TSH: 3.19 u[IU]/mL (ref 0.35–4.50)

## 2021-03-18 NOTE — Progress Notes (Signed)
Subjective:    Patient ID: Adam Vasquez, male    DOB: [birth year], 80 y.o.   MRN: [id number]  HPI Pt is referred by Dr Marjo Bicker, for low testosterone level.  Pt reports he had puberty at the normal age.  He has 2 biological children.  He says he has never taken illicit androgens.  He has never had pituitary imaging. He has never been on any prescribed medication for hypogonadism.  He does not take antiandrogens or opioids.  He denies any h/o infertility, XRT, or genital infection.  He has never had surgery, or a serious injury to the head or genital area. He has no h/o sleep apnea or DVT.   He does not consume alcohol excessively.  He reports ED sxs.   Past Medical History:  Diagnosis Date   Allergy    Hypertension     No past surgical history on file.  Social History   Socioeconomic History   Marital status: Married    Spouse name: [place]   Number of children: 2   Years of education: 18   Highest education level: Not on file  Occupational History   Occupation: Pyschologist  Tobacco Use   Smoking status: Former    Pack years: 0.00    Types: Cigarettes    Quit date: 05/18/1976    Years since quitting: 44.8   Smokeless tobacco: Never  Substance and Sexual Activity   Alcohol use: Yes    Alcohol/week: 4.0 standard drinks    Types: 4 drink(s) per week   Drug use: No   Sexual activity: Yes  Other Topics Concern   Not on file  Social History Narrative   Health Care POA: Si Jachim, wife    Emergency Contact: [place] or daughter Keni Elison (c) [phone], (h) [phone]   End of Life Plan: pt reports having living will. Pt will bring a copy to their next visit.   Who lives with you: wife   Any pets: none   Diet: Pt has a varied diet and focuses on fruits and vegetables.  Pt reports not snacking any longer.   Exercise: Pt swims, runs, and does weight resistance 3-4 times a week.   Seatbelts: Pt reports wearing seatbelt when in vehicle.   Nancy Fetter  Exposure/Protection: Pt reports wearing hats when he is running outside and wearing sunscreen at the beach.    Hobbies: running, music, travel, gardening               Social Determinants of Health   Financial Resource Strain: Not on file  Food Insecurity: Not on file  Transportation Needs: Not on file  Physical Activity: Not on file  Stress: Not on file  Social Connections: Not on file  Intimate Partner Violence: Not on file    Current Outpatient Medications on File Prior to Visit  Medication Sig Dispense Refill   Ascorbic Acid (VITAMIN C) 500 MG tablet Take 500 mg by mouth daily.       Cholecalciferol (VITAMIN D3 PO) Take by mouth.     diclofenac sodium (VOLTAREN) 1 % GEL Apply 2 g topically 4 (four) times daily. 100 g 12   hydrochlorothiazide (HYDRODIURIL) 25 MG tablet Take 1 tablet by mouth once daily (Patient taking differently: Taking 12.5  tablet daily) 90 tablet 3   lisinopril (ZESTRIL) 20 MG tablet Take 1 tablet by mouth twice daily 180 tablet 3   loratadine (CLARITIN) 10 MG tablet Take 10 mg by mouth daily as needed.  Omega-3 Fatty Acids (RA FISH OIL) 1000 MG CAPS Take 3 capsules by mouth once daily     No current facility-administered medications on file prior to visit.    No Known Allergies  Family History  Problem Relation Age of Onset   Diabetes Mother    Heart disease Mother    Diabetes Father    Other Neg Hx     Ht 5\' 10"  (1.778 m)   Wt 199 lb (90.3 kg)   BMI 28.55 kg/m     Review of Systems denies depression, numbness, weight change, headache, and sob.      Objective:   Physical Exam VS: see vs page GEN: no distress HEAD: head: no deformity eyes: no periorbital swelling, no proptosis external nose and ears are normal NECK: supple, thyroid is not enlarged CHEST WALL: no deformity LUNGS: clear to auscultation BREASTS:  No gynecomastia CV: reg rate and rhythm, no murmur GENITALIA: testicles are very small and soft.   MUSCULOSKELETAL:  muscle bulk and strength are grossly normal.  no joint swelling is seen  gait is normal and steady EXTEMITIES: no leg edema NEURO: sensation is intact to touch on all 4's SKIN:  Normal texture and temperature.  No rash or suspicious lesion is visible.  Normal male hair distribution. NODES:  None palpable at the neck PSYCH: alert, well-oriented.  Does not appear anxious nor depressed.    I have reviewed outside records, and summarized: Pt was noted to have low testosterone, and referred here.  Multiple meds for ED were ineffective     Assessment & Plan:  Low testosterone, uncontrolled, uncertain etiology.    Patient Instructions  Blood tests are requested for you today.  We'll let you know about the results.  Based on the results, you may need an MRI of the brain.  Also, I may be able to prescribe for you a pill to increase the testosterone.

## 2021-03-18 NOTE — Patient Instructions (Addendum)
Blood tests are requested for you today.  We'll let you know about the results.  Based on the results, you may need an MRI of the brain.  Also, I may be able to prescribe for you a pill to increase the testosterone.

## 2021-03-19 LAB — PROLACTIN: Prolactin: 3.6 ng/mL (ref 2.0–18.0)

## 2021-03-20 ENCOUNTER — Telehealth: Payer: Self-pay | Admitting: Endocrinology

## 2021-03-20 LAB — TESTOSTERONE,FREE AND TOTAL
Testosterone, Free: 2.8 pg/mL — ABNORMAL LOW (ref 6.6–18.1)
Testosterone: 143 ng/dL — ABNORMAL LOW (ref 264–916)

## 2021-03-20 NOTE — Telephone Encounter (Signed)
Adam Vasquez from [facility] is wanting to see if we can fax over chart notes from the visit on 03/18/2021  Fax number-[phone]

## 2021-03-20 NOTE — Telephone Encounter (Signed)
Internal fax sent to [facility] hospital @[phone] with chart notes from 03/18/2021

## 2021-03-24 ENCOUNTER — Other Ambulatory Visit: Payer: Self-pay

## 2021-03-24 ENCOUNTER — Encounter: Payer: Self-pay | Admitting: Family Medicine

## 2021-03-24 ENCOUNTER — Ambulatory Visit (INDEPENDENT_AMBULATORY_CARE_PROVIDER_SITE_OTHER): Payer: Medicare HMO | Admitting: Family Medicine

## 2021-03-24 DIAGNOSIS — I10 Essential (primary) hypertension: Secondary | ICD-10-CM | POA: Diagnosis not present

## 2021-03-24 DIAGNOSIS — Z9189 Other specified personal risk factors, not elsewhere classified: Secondary | ICD-10-CM | POA: Diagnosis not present

## 2021-03-24 NOTE — Progress Notes (Signed)
    SUBJECTIVE:   CHIEF COMPLAINT / HPI:   Concerned about elevated BP readings.  He has had a few doctors visits in which BP was significally elevated.  He wonders if he needs more medications.  Also, he is appropriately concerned about his ASCVD risk.  As of now, he is primary prevention.  He does have a moderate FHx of CVA and CAD.  10 year ASCVD risk calculates to 21%.  Not on statin.  Not on ASA (I had stopped.      OBJECTIVE:   BP 128/74   Pulse 62   Ht 5\' 10"  (1.778 m)   Wt 198 lb 3.2 oz (89.9 kg)   SpO2 97%   BMI 28.44 kg/m   BP noted to be excellent. Lungs clear Cardiac rRR without m or g  ASSESSMENT/PLAN:   HYPERTENSION, BENIGN SYSTEMIC Likely well controled.  Given sporadic high readings, will confirm control by 24 hour BP monitoring.  Risk for coronary artery disease greater than 20% in next 10 years Offered statin and he declined.  Led to long discussion between primary and secondary prevention.  If he had known CAD, he would accept statin and restart ASA.  He seems a good candidate for coronary calcium CT testing.  I am not sure if insurance will pay given his age of 80 (studies done to age 80.)  Patient is a young 80.     Zenia Resides, MD [place]

## 2021-03-24 NOTE — Telephone Encounter (Signed)
Patient returned a call to Mardene Celeste - requests a call back at [phone]

## 2021-03-24 NOTE — Patient Instructions (Addendum)
When convenitent, bring in the records for your COVID and shingrix vaccines so that we can document in our chart. Make an appointment on your way out with [facility], our new pharmacologist, for ambulatory 24 hour blood pressure monitoring.  We will make decisions about changes for you meds based on those readings.   Read up on coronary artery CT calcium score testing.  It is not universally recommend and is our current best test for screening for coronary artery disease.   Big question is primary versus secondary prevention.

## 2021-03-24 NOTE — Assessment & Plan Note (Signed)
Likely well controled.  Given sporadic high readings, will confirm control by 24 hour BP monitoring.

## 2021-03-24 NOTE — Assessment & Plan Note (Signed)
Offered statin and he declined.  Led to long discussion between primary and secondary prevention.  If he had known CAD, he would accept statin and restart ASA.  He seems a good candidate for coronary calcium CT testing.  I am not sure if insurance will pay given his age of 6 (studies done to age 80.)  Patient is a young 55.

## 2021-03-25 ENCOUNTER — Other Ambulatory Visit: Payer: Self-pay | Admitting: Endocrinology

## 2021-03-25 DIAGNOSIS — R7989 Other specified abnormal findings of blood chemistry: Secondary | ICD-10-CM

## 2021-03-25 MED ORDER — TESTOSTERONE 12.5 MG/ACT (1%) TD GEL: 1.0000 | Freq: Every day | TRANSDERMAL | 0 refills | Status: DC

## 2021-03-26 ENCOUNTER — Telehealth: Payer: Self-pay | Admitting: Pharmacy Technician

## 2021-03-26 NOTE — Telephone Encounter (Addendum)
Patient Advocate Encounter   Received notification from [place] [facility] that prior authorization for TESTOSTERONE GET 1% is required.   PA submitted on 03/26/2021 Key [id number] Status is DENIED    [facility] will continue to follow.   Venida Jarvis. Nadara Mustard, CPhT Patient Advocate [facility] Phone: [phone] Fax:  [phone]

## 2021-03-26 NOTE — Telephone Encounter (Signed)
[facility] Patient Advocate Encounter  An APPEAL was made over the phone and APPROVED.  09/28/2020 - 09/27/2021    [facility] will continue to follow.   Venida Jarvis. Nadara Mustard, CPhT Patient Advocate [facility] Phone: [phone] Fax:  [phone]

## 2021-03-27 ENCOUNTER — Telehealth: Payer: Self-pay | Admitting: Endocrinology

## 2021-03-28 ENCOUNTER — Telehealth: Payer: Self-pay | Admitting: Endocrinology

## 2021-03-28 NOTE — Telephone Encounter (Signed)
Pt called the office to report when he went to the pharmacy on Wednesday to pick up his new medication they told him it would not be covered by his insurance. Pt is confused why it wouldn't be covered but would like for [place] to resend the prescription to the [facility] because he thinks he will get a better price there. Pt believes it is the testosterone gel but is not sure and would like the Dr to confirm.   Pt also states he had his labs drawn recently and was not informed of his results. He requests a call to go over his results as well as a copy sent to his address and a copy sent to his referring provider, Dr. Reubin Milan.  Please resend the prescription to : [place], [address] [facility] [address] Phone:  [phone]  Fax:  [phone]

## 2021-04-01 ENCOUNTER — Other Ambulatory Visit: Payer: Self-pay

## 2021-04-01 ENCOUNTER — Ambulatory Visit (INDEPENDENT_AMBULATORY_CARE_PROVIDER_SITE_OTHER): Payer: Medicare HMO | Admitting: Pharmacist

## 2021-04-01 DIAGNOSIS — I10 Essential (primary) hypertension: Secondary | ICD-10-CM | POA: Diagnosis not present

## 2021-04-01 NOTE — Patient Instructions (Addendum)
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   - Start Amlodipine 2.5mg  once daily.

## 2021-04-02 ENCOUNTER — Encounter: Payer: Self-pay | Admitting: Pharmacist

## 2021-04-02 MED ORDER — AMLODIPINE BESYLATE 2.5 MG PO TABS: 2.5000 mg | ORAL_TABLET | Freq: Every day | ORAL | 1 refills | Status: DC

## 2021-04-02 NOTE — Assessment & Plan Note (Signed)
History of hypertension since 2021 found to have isolated systolic hypertension given 24-hour ambulatory blood pressure demonstrates slightly higher than goal systolic readings with an average awake blood pressure of 154/83 mmHg, and a nocturnal dipping pattern that is normal.   Changes to medications discussed with Dr. Andria Frames. - Add  amlodipine 2.5mg  once daily

## 2021-04-02 NOTE — Progress Notes (Signed)
S:    Patient arrives in good spirits, ambulating comfortably without assistance.  Presents to the clinic for ambulatory blood pressure evaluation.   Patient was referred and last seen by Primary Care Provider, Dr. Andria Frames on 03/24/2021.   Diagnosed with Hypertension in the year of 2012.    Medication compliance is reported to be excellent.  Discussed procedure for wearing the monitor and gave patient written instructions. Monitor was placed on non-dominant arm with instructions to return in the morning.   Current BP Medications include:  lisinopril 40mg  once daily (2x20mg ) and HCTZ 12.5mg  daily (takes 1/2 of 25mg  tablet)  O:  Physical Exam Constitutional:      Appearance: Normal appearance. He is normal weight.  Cardiovascular:     Rate and Rhythm: Bradycardia present.  Pulmonary:     Effort: Pulmonary effort is normal.  Neurological:     Mental Status: He is alert.  Psychiatric:        Mood and Affect: Mood normal.        Behavior: Behavior normal.        Thought Content: Thought content normal.    Review of Systems  Neurological:  Positive for headaches.  All other systems reviewed and are negative.  Last 3 Office BP readings: BP Readings from Last 3 Encounters:  03/24/21 128/74  08/19/20 138/74  07/27/19 120/80     Clinical Atherosclerotic Cardiovascular Disease (ASCVD): No  The 10-year ASCVD risk score Mikey Bussing DC Jr., et al., 2013) is: 33.5%   Values used to calculate the score:     Age: 80 years     Sex: Male     Is Non-Hispanic African American: Yes     Diabetic: No     Tobacco smoker: Yes     Systolic Blood Pressure: 149 mmHg     Is BP treated: Yes     HDL Cholesterol: 83 mg/dL     Total Cholesterol: 180 mg/dL  Basic Metabolic Panel    Component Value Date/Time   NA 138 03/18/2021 0841   NA 141 08/19/2020 0920   K 4.0 03/18/2021 0841   CL 101 03/18/2021 0841   CO2 29 03/18/2021 0841   GLUCOSE 112 (H) 03/18/2021 0841   BUN 16 03/18/2021 0841   BUN  16 08/19/2020 0920   CREATININE 1.00 03/18/2021 0841   CREATININE 1.18 09/15/2016 0833   CALCIUM 9.2 03/18/2021 0841   GFRNONAA 66 08/19/2020 0920   GFRNONAA 60 09/15/2016 0833   GFRAA 77 08/19/2020 0920   GFRAA 69 09/15/2016 0833    Renal function: Estimated Creatinine Clearance: 67.4 mL/min (by C-G formula based on SCr of 1 mg/dL).   ABPM Study Data: Arm Placement left arm  Overall Mean 24hr BP:   146/77 mmHg HR: 50  Daytime Mean BP:  154/83 mmHg HR: 52  Nighttime Mean BP:  128/62 mmHg HR: 47  Dipping Pattern: Yes.    Sys:   17%   Dia: 25%   [normal dipping ~10-20%]  Non-hypertensive ABPM thresholds: daytime BP <125/75 mmHg, sleeptime BP <120/70 mmHg    A/P: History of hypertension since 2021 found to have isolated systolic hypertension given 24-hour ambulatory blood pressure demonstrates slightly higher than goal systolic readings with an average awake blood pressure of 154/83 mmHg, and a nocturnal dipping pattern that is normal.   Changes to medications discussed with Dr. Andria Frames. - Add  amlodipine 2.5mg  once daily    Results reviewed and written information provided.  Total time in face-to-face counseling  25 minutes.   F/U Clinic Visit with Dr. Andria Frames in 2 months.

## 2021-04-02 NOTE — Progress Notes (Signed)
Noted and agree. 

## 2021-04-02 NOTE — Telephone Encounter (Signed)
Please advis

## 2021-04-03 NOTE — Telephone Encounter (Signed)
Patient called to advise that the fax # for the [place], [place] [phone]  Please call patient (Dr Mancel Bale) when RX is sent [phone]

## 2021-04-03 NOTE — Telephone Encounter (Signed)
I have contacted the pharmacy to cancel the Testosterone gel.

## 2021-04-04 ENCOUNTER — Other Ambulatory Visit: Payer: Self-pay | Admitting: Endocrinology

## 2021-04-04 MED ORDER — TESTOSTERONE 12.5 MG/ACT (1%) TD GEL: 1.0000 | Freq: Every day | TRANSDERMAL | 0 refills | Status: DC

## 2021-04-07 ENCOUNTER — Other Ambulatory Visit: Payer: Self-pay | Admitting: Endocrinology

## 2021-04-07 MED ORDER — TESTOSTERONE 20.25 MG/ACT (1.62%) TD GEL: 1.0000 | Freq: Every day | TRANSDERMAL | 0 refills | Status: DC

## 2021-04-07 NOTE — Telephone Encounter (Signed)
Adam Vasquez called from [place] they do not carry the Testosterone 12.5. They have 1.62 % 20.25 MG gel pump that is the [place] preferred can you switch patient to that instead

## 2021-04-15 DIAGNOSIS — N529 Male erectile dysfunction, unspecified: Secondary | ICD-10-CM | POA: Diagnosis not present

## 2021-04-15 DIAGNOSIS — Z87891 Personal history of nicotine dependence: Secondary | ICD-10-CM | POA: Diagnosis not present

## 2021-04-15 DIAGNOSIS — I10 Essential (primary) hypertension: Secondary | ICD-10-CM | POA: Diagnosis not present

## 2021-04-15 DIAGNOSIS — Z6830 Body mass index (BMI) 30.0-30.9, adult: Secondary | ICD-10-CM | POA: Diagnosis not present

## 2021-04-15 DIAGNOSIS — E669 Obesity, unspecified: Secondary | ICD-10-CM | POA: Diagnosis not present

## 2021-04-15 DIAGNOSIS — Z833 Family history of diabetes mellitus: Secondary | ICD-10-CM | POA: Diagnosis not present

## 2021-04-15 DIAGNOSIS — Z008 Encounter for other general examination: Secondary | ICD-10-CM | POA: Diagnosis not present

## 2021-04-15 DIAGNOSIS — Z8249 Family history of ischemic heart disease and other diseases of the circulatory system: Secondary | ICD-10-CM | POA: Diagnosis not present

## 2021-04-28 ENCOUNTER — Telehealth: Payer: Self-pay

## 2021-04-28 NOTE — Telephone Encounter (Signed)
Called:  Lightheaded and sleepy just since amlodipine.  BP is normal 130/80.  Likely more a side effect than over treatment of hypertension.  Told to stop amlodipine and keep appointment with me on 8/8.

## 2021-04-28 NOTE — Telephone Encounter (Signed)
Patient calls nurse line regarding side effects to Amlodipine. Patient reports that since starting the medication, he has been experiencing increased fatigue and very slight lightheadedness. Patient states "I just don't feel like myself."  Scheduled patient for next available appointment on 8/8 with PCP.   Please advise additional recommendations in the meantime.   Talbot Grumbling, RN

## 2021-05-05 ENCOUNTER — Other Ambulatory Visit: Payer: Self-pay

## 2021-05-05 ENCOUNTER — Encounter: Payer: Self-pay | Admitting: Family Medicine

## 2021-05-05 ENCOUNTER — Ambulatory Visit (INDEPENDENT_AMBULATORY_CARE_PROVIDER_SITE_OTHER): Payer: Medicare HMO | Admitting: Family Medicine

## 2021-05-05 DIAGNOSIS — I10 Essential (primary) hypertension: Secondary | ICD-10-CM

## 2021-05-05 MED ORDER — HYDROCHLOROTHIAZIDE 25 MG PO TABS: 25.0000 mg | ORAL_TABLET | Freq: Every day | ORAL | 3 refills | Status: DC

## 2021-05-05 NOTE — Assessment & Plan Note (Signed)
Sub optimal control.  Will increase HCTZ to 25 mg daily.

## 2021-05-05 NOTE — Patient Instructions (Addendum)
Please increase your hydrochlorothiazide to one tab 25 mg daily. Please come back at your convenience for a nurse visit, blood check.   Please send Adam Vasquez your shingles and covid vaccine records.   When you are in for a blood pressure check, you can get a flu shot.  We usually get our supply in late September.   I can see you in January if things are going well.

## 2021-05-05 NOTE — Progress Notes (Signed)
    SUBJECTIVE:   CHIEF COMPLAINT / HPI:   FU hypertension: could not tolerate amlodipine due to lightheadedness, fatigue and heat intolerance.  Only one home BP reading which was normal.  Has been off amlodipine x 1 week.  Feeling back to normal  As an aside, wife having knee replacement surgery next week.    OBJECTIVE:   BP (!) 144/88   Pulse (!) 50   Ht '5\' 10"'$  (1.778 m)   Wt 199 lb 3.2 oz (90.4 kg)   SpO2 97%   BMI 28.58 kg/m   Modest BP elevation noted and bradycardia Lungs clear Cardiac RRR  ASSESSMENT/PLAN:   HYPERTENSION, BENIGN SYSTEMIC Sub optimal control.  Will increase HCTZ to 25 mg daily.     Zenia Resides, MD [place]

## 2021-05-06 ENCOUNTER — Ambulatory Visit: Payer: Medicare HMO | Admitting: Pharmacist

## 2021-09-01 ENCOUNTER — Encounter: Payer: Self-pay | Admitting: Family Medicine

## 2021-09-01 ENCOUNTER — Other Ambulatory Visit: Payer: Self-pay

## 2021-09-01 ENCOUNTER — Ambulatory Visit (INDEPENDENT_AMBULATORY_CARE_PROVIDER_SITE_OTHER): Payer: Medicare HMO | Admitting: Family Medicine

## 2021-09-01 VITALS — BP 138/85 | HR 54 | Ht 70.0 in | Wt 198.0 lb

## 2021-09-01 DIAGNOSIS — Z0000 Encounter for general adult medical examination without abnormal findings: Secondary | ICD-10-CM | POA: Diagnosis not present

## 2021-09-01 DIAGNOSIS — I10 Essential (primary) hypertension: Secondary | ICD-10-CM

## 2021-09-01 DIAGNOSIS — R739 Hyperglycemia, unspecified: Secondary | ICD-10-CM | POA: Diagnosis not present

## 2021-09-01 DIAGNOSIS — S60412A Abrasion of right middle finger, initial encounter: Secondary | ICD-10-CM | POA: Diagnosis not present

## 2021-09-01 DIAGNOSIS — Z23 Encounter for immunization: Secondary | ICD-10-CM

## 2021-09-01 DIAGNOSIS — L089 Local infection of the skin and subcutaneous tissue, unspecified: Secondary | ICD-10-CM | POA: Diagnosis not present

## 2021-09-01 LAB — POCT GLYCOSYLATED HEMOGLOBIN (HGB A1C): HbA1c, POC (prediabetic range): 6.1 % (ref 5.7–6.4)

## 2021-09-01 NOTE — Assessment & Plan Note (Signed)
Control likely fine.  Check BP at home multiple times to be certain..  Labs today.

## 2021-09-01 NOTE — Assessment & Plan Note (Signed)
Healthy male with no at risk behaviors. 

## 2021-09-01 NOTE — Assessment & Plan Note (Signed)
A1C today is 6.1 which is in the prediabetic range.

## 2021-09-01 NOTE — Progress Notes (Signed)
    SUBJECTIVE:   CHIEF COMPLAINT / HPI:   Annual physical. Acute problems. Infection of middle finger right hand.  Began as an infected cuticle. Chronic problems ED has tried two different phosphodiesterase inhibitors, a pump and injections.  Currently working with urology from [place]. Hypertension.  Initial BP high.  Second BP OK.  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  No longer runs.  Walking is now his major form of exercise.   HPDP Needs tetanus.  Has already had flu and bivalent covid vaccines.  Needs lipids.   PERTINENT  PMH / PSH: Denies CP, DOE, abd pain, cough, bleeding, weakness, numbness or skin changes.  Denies change in bowel, bladder appetite of weight.  OBJECTIVE:   BP 138/85 (BP Location: Left Arm)   Pulse (!) 54   Ht 5\' 10"  (1.778 m)   Wt 198 lb (89.8 kg)   SpO2 100%   BMI 28.41 kg/m   HEENT WNL Neck supple, Lungs clear Cardiac rRR without m or g Abd benign Ext no edema. Right middle finger mild inflammation at cuticle. Neuro, Motor, sensory gait, cognition and affect grossly normal throughout.  ASSESSMENT/PLAN:   Routine general medical examination at a health care facility Healthy male with no at risk behaviors.  HYPERTENSION, BENIGN SYSTEMIC Control likely fine.  Check BP at home multiple times to be certain..  Labs today.  Hyperglycemia A1C today is 6.1 which is in the prediabetic range.  Abrasion of right middle finger with infection Bactroban oint.  Tdap today.     Zenia Resides, MD [place]

## 2021-09-01 NOTE — Assessment & Plan Note (Signed)
Bactroban oint.  Tdap today.

## 2021-09-01 NOTE — Patient Instructions (Signed)
You look great I will call with the lab test results.   I hope your wonderful life continues.   Tetanus shot today for the finger infection.

## 2021-09-02 ENCOUNTER — Encounter: Payer: Self-pay | Admitting: Family Medicine

## 2021-09-02 LAB — CBC
Hematocrit: 39.2 % (ref 37.5–51.0)
Hemoglobin: 12.5 g/dL — ABNORMAL LOW (ref 13.0–17.7)
MCH: 28.9 pg (ref 26.6–33.0)
MCHC: 31.9 g/dL (ref 31.5–35.7)
MCV: 91 fL (ref 79–97)
Platelets: 219 10*3/uL (ref 150–450)
RBC: 4.33 x10E6/uL (ref 4.14–5.80)
RDW: 12 % (ref 11.6–15.4)
WBC: 4 10*3/uL (ref 3.4–10.8)

## 2021-09-02 LAB — CMP14+EGFR
ALT: 25 IU/L (ref 0–44)
AST: 25 IU/L (ref 0–40)
Albumin/Globulin Ratio: 1.6 (ref 1.2–2.2)
Albumin: 4.8 g/dL — ABNORMAL HIGH (ref 3.7–4.7)
Alkaline Phosphatase: 65 IU/L (ref 44–121)
BUN/Creatinine Ratio: 12 (ref 10–24)
BUN: 13 mg/dL (ref 8–27)
Bilirubin Total: 0.9 mg/dL (ref 0.0–1.2)
CO2: 25 mmol/L (ref 20–29)
Calcium: 9.6 mg/dL (ref 8.6–10.2)
Chloride: 101 mmol/L (ref 96–106)
Creatinine, Ser: 1.1 mg/dL (ref 0.76–1.27)
Globulin, Total: 3 g/dL (ref 1.5–4.5)
Glucose: 128 mg/dL — ABNORMAL HIGH (ref 70–99)
Potassium: 4.6 mmol/L (ref 3.5–5.2)
Sodium: 140 mmol/L (ref 134–144)
Total Protein: 7.8 g/dL (ref 6.0–8.5)
eGFR: 68 mL/min/{1.73_m2} (ref >59)

## 2021-09-02 LAB — LIPID PANEL
Chol/HDL Ratio: 2.3 ratio (ref 0.0–5.0)
Cholesterol, Total: 177 mg/dL (ref 100–199)
HDL: 78 mg/dL (ref >39)
LDL Chol Calc (NIH): 89 mg/dL (ref 0–99)
Triglycerides: 47 mg/dL (ref 0–149)
VLDL Cholesterol Cal: 10 mg/dL (ref 5–40)

## 2021-09-05 ENCOUNTER — Other Ambulatory Visit: Payer: Self-pay | Admitting: Family Medicine

## 2021-09-05 DIAGNOSIS — I10 Essential (primary) hypertension: Secondary | ICD-10-CM

## 2021-09-05 NOTE — Telephone Encounter (Signed)
Called.  Offered to switch to 40 mg tabs.  He believes it is cheaper to take two 20s.

## 2022-01-26 ENCOUNTER — Encounter: Payer: Self-pay | Admitting: Family Medicine

## 2022-01-26 NOTE — Progress Notes (Signed)
[facility].996 [facility]15/22 ?

## 2022-01-29 ENCOUNTER — Ambulatory Visit (INDEPENDENT_AMBULATORY_CARE_PROVIDER_SITE_OTHER): Payer: No Typology Code available for payment source | Admitting: Family Medicine

## 2022-01-29 VITALS — BP 142/80 | HR 63 | Temp 98.1°F | Ht 70.0 in | Wt 199.4 lb

## 2022-01-29 DIAGNOSIS — I10 Essential (primary) hypertension: Secondary | ICD-10-CM

## 2022-01-29 DIAGNOSIS — R350 Frequency of micturition: Secondary | ICD-10-CM

## 2022-01-29 DIAGNOSIS — M7918 Myalgia, other site: Secondary | ICD-10-CM | POA: Diagnosis not present

## 2022-01-29 NOTE — Patient Instructions (Addendum)
It was wonderful to see you today. ? ?Please bring ALL of your medications with you to every visit.  ? ?Today we talked about: ? ?Muscle strain, cramp. You have good strength. I would advise good stretching before activity and can continue with ice and heat if desired. Or hot bath soaks. OTC ibuprofen 200-400 mg daily no more than 5-7 days.  ?I will follow up with you when urinary results available.  ? ?Please be sure to schedule follow up at the front  desk before you leave today.  ? ?If you haven't already, sign up for My Chart to have easy access to your labs results, and communication with your primary care physician. ? ?Please call the clinic at [phone] if your symptoms worsen or you have any concerns. It was our pleasure to serve you. ? ?Dr. Janus Molder ? ?

## 2022-01-29 NOTE — Progress Notes (Signed)
? ? ?  SUBJECTIVE:  ? ?CHIEF COMPLAINT / HPI:  ? ?Leg pain ?Experienced a "charlie horse" in his right posterior thigh Monday morning that is getting better. He walked Monday which the cramp did not improve. He tried 1000 mg of tylenol for 3 days and heat to the area. He believes he has a burn in the area where he applied the heat. He has placed ointment on the area. He states he usually receives phenylbutazone for the leg cramps and this helps. He endorses symptoms of increased urination, chills, and dry mouth. Denies fever and saddle anesthesia.  ? ?Hypertension: ?- Medications: HCTZ 25 MG daily, lisinopril 20 mg daily ?- Compliance: Yes ?- Denies any SOB, CP, vision changes, LE edema, medication SEs, or symptoms of hypotension ? ?PERTINENT  PMH / PSH: OA ? ?OBJECTIVE:  ? ?BP (!) 142/80   Pulse 63   Temp 98.1 ?F (36.7 ?C)   Ht '5\' 10"'$  (1.778 m)   Wt 199 lb 6.4 oz (90.4 kg)   SpO2 97%   BMI 28.61 kg/m?   ?Physical Exam ?Vitals reviewed. Exam conducted with a chaperone present.  ?Constitutional:   ?   General: He is not in acute distress. ?   Appearance: He is not ill-appearing, toxic-appearing or diaphoretic.  ?Pulmonary:  ?   Effort: Pulmonary effort is normal.  ?Musculoskeletal:  ?   Comments: Right posterior leg and buttock exam ?- Inspection: no gross deformity b/l. No swelling/effusion, erythema or bruising b/l. Skin intact. No evidence of burn. ?- Palpation: TTP at right ischial tuberosity ?- ROM: full active ROM with flexion and extension in knee and hip b/l ?- Strength: 5/5 strength b/l ?- Neuro/vasc: NV intact distally b/l ?- Special Tests:  Negative Trendelenberg.  ?  ?Neurological:  ?   Mental Status: He is alert and oriented to person, place, and time.  ?   Sensory: No sensory deficit.  ?   Motor: No weakness.  ?   Gait: Gait abnormal.  ?Psychiatric:     ?   Mood and Affect: Mood normal.     ?   Behavior: Behavior normal.  ? ? ? ?ASSESSMENT/PLAN:  ? ?1. Acute buttock pain ?4 days of right  buttock/posterior thigh pain now improving. Tender at attachment for hamstring muscles. Likely muscle strain. Strength and sensation are preserved. Advised stretching and heat before walking activity and icing after. OTC ibuprofen for short time. If phenylbutazone desired; follow up with PCP to discuss.  ? ?2. Increased urinary frequency ?Unclear etiology. Denies dysuria, odor, dark color. Urine sample given today. Will follow.  ?- Urinalysis, Routine w reflex microscopic ? ?3. HYPERTENSION, BENIGN SYSTEMIC ?Repeat mildly elevated. Continue current dose and follow up with PCP as above.  ? ? ?  ? ? ?Aurel Nguyen Autry-Lott, DO ?[place]  ?

## 2022-01-30 ENCOUNTER — Telehealth: Payer: Self-pay

## 2022-01-30 NOTE — Telephone Encounter (Signed)
Patient calls nurse line requesting UA results from yesterdays visit.  ? ?I see where a UA was ordered, however I do not see where anything resulted.  ? ?I reached out to lab in regards to results. Per lab, the test was not performed due to possible communication error.  ? ?Patient has been updated and plans to retest on Monday at PCP apt.  ?

## 2022-02-02 ENCOUNTER — Ambulatory Visit (INDEPENDENT_AMBULATORY_CARE_PROVIDER_SITE_OTHER): Payer: No Typology Code available for payment source | Admitting: Family Medicine

## 2022-02-02 ENCOUNTER — Encounter: Payer: Self-pay | Admitting: Family Medicine

## 2022-02-02 VITALS — BP 132/78 | HR 68 | Ht 70.0 in | Wt 198.2 lb

## 2022-02-02 DIAGNOSIS — R350 Frequency of micturition: Secondary | ICD-10-CM | POA: Diagnosis not present

## 2022-02-02 DIAGNOSIS — M62838 Other muscle spasm: Secondary | ICD-10-CM | POA: Diagnosis not present

## 2022-02-02 LAB — POCT URINALYSIS DIP (MANUAL ENTRY)
Bilirubin, UA: NEGATIVE
Blood, UA: NEGATIVE
Glucose, UA: NEGATIVE mg/dL
Ketones, POC UA: NEGATIVE mg/dL
Leukocytes, UA: NEGATIVE
Nitrite, UA: NEGATIVE
Protein Ur, POC: NEGATIVE mg/dL
Spec Grav, UA: 1.01 (ref 1.010–1.025)
Urobilinogen, UA: 0.2 E.U./dL
pH, UA: 5.5 (ref 5.0–8.0)

## 2022-02-02 NOTE — Telephone Encounter (Signed)
Discussed with lab.  Order missed.  Seeing patient today.  Will recollect specimen ?

## 2022-02-02 NOTE — Patient Instructions (Signed)
It is a mystery what illness you had - but it seems to be over now.   ?Your urine is clear. ?Enjoy [place] and Mothers Day. ? ?POCT urinalysis dipstick ?Order: [id number] ?Status: Final result    ?Visible to patient: No (scheduled for 02/02/2022 10:06 AM)    ?Next appt: None    ?Dx: Spasm of abdominal muscles    ?0 Result Notes ?    ?Component Ref Range & Units 09:00  ?Color, UA yellow yellow   ?Clarity, UA clear clear   ?Glucose, UA negative mg/dL negative   ?Bilirubin, UA negative negative   ?Ketones, POC UA negative mg/dL negative   ?Spec Grav, UA 1.010 - 1.025 1.010   ?Blood, UA negative negative   ?pH, UA 5.0 - 8.0 5.5   ?Protein Ur, POC negative mg/dL negative   ?Urobilinogen, UA 0.2 or 1.0 E.U./dL 0.2   ?Nitrite, UA Negative Negative   ?Leukocytes, UA Negative Negative   ?Resulting Agency  [facility]  ?  ? ?

## 2022-02-03 ENCOUNTER — Encounter: Payer: Self-pay | Admitting: Family Medicine

## 2022-02-03 DIAGNOSIS — R350 Frequency of micturition: Secondary | ICD-10-CM | POA: Insufficient documentation

## 2022-02-03 NOTE — Progress Notes (Signed)
? ? ?  SUBJECTIVE:  ? ?CHIEF COMPLAINT / HPI:  ? ?FU office visit Dr. Janus Molder 5/4. ?Had two problems ?Right buttocks pain and muscle spasm, improving with Dr. Azalia Bilis treatment. ?Chills, no documented fever, and urinary frequency.  POCT UA was intended but not resulted.  Chills and frequency have resolved.  Wanted to have UA done for completeness ? ? ? ?OBJECTIVE:  ? ?BP 132/78   Pulse 68   Ht '5\' 10"'$  (1.778 m)   Wt 198 lb 3.2 oz (89.9 kg)   SpO2 97%   BMI 28.44 kg/m?   ?Abd benign ? ?UA normal ? ?ASSESSMENT/PLAN:  ? ?Urinary frequency ?Resolved and UA normal.  No additional FU needed. ?  ? ? ?Zenia Resides, MD ?[place]  ?

## 2022-02-03 NOTE — Assessment & Plan Note (Signed)
Resolved and UA normal.  No additional FU needed. ?

## 2022-06-11 ENCOUNTER — Other Ambulatory Visit: Payer: Self-pay | Admitting: Family Medicine

## 2022-06-11 DIAGNOSIS — I10 Essential (primary) hypertension: Secondary | ICD-10-CM

## 2022-07-03 DIAGNOSIS — E663 Overweight: Secondary | ICD-10-CM | POA: Diagnosis not present

## 2022-07-03 DIAGNOSIS — F17211 Nicotine dependence, cigarettes, in remission: Secondary | ICD-10-CM | POA: Diagnosis not present

## 2022-07-03 DIAGNOSIS — I77819 Aortic ectasia, unspecified site: Secondary | ICD-10-CM | POA: Diagnosis not present

## 2022-07-03 DIAGNOSIS — N182 Chronic kidney disease, stage 2 (mild): Secondary | ICD-10-CM | POA: Diagnosis not present

## 2022-07-03 DIAGNOSIS — Z6827 Body mass index (BMI) 27.0-27.9, adult: Secondary | ICD-10-CM | POA: Diagnosis not present

## 2022-07-03 DIAGNOSIS — Z008 Encounter for other general examination: Secondary | ICD-10-CM | POA: Diagnosis not present

## 2022-07-03 DIAGNOSIS — I129 Hypertensive chronic kidney disease with stage 1 through stage 4 chronic kidney disease, or unspecified chronic kidney disease: Secondary | ICD-10-CM | POA: Diagnosis not present

## 2022-09-03 ENCOUNTER — Ambulatory Visit (INDEPENDENT_AMBULATORY_CARE_PROVIDER_SITE_OTHER): Payer: No Typology Code available for payment source | Admitting: Family Medicine

## 2022-09-03 ENCOUNTER — Encounter: Payer: Self-pay | Admitting: Family Medicine

## 2022-09-03 VITALS — BP 130/62 | HR 78 | Ht 70.0 in | Wt 199.0 lb

## 2022-09-03 DIAGNOSIS — R739 Hyperglycemia, unspecified: Secondary | ICD-10-CM

## 2022-09-03 DIAGNOSIS — I10 Essential (primary) hypertension: Secondary | ICD-10-CM | POA: Diagnosis not present

## 2022-09-03 DIAGNOSIS — D649 Anemia, unspecified: Secondary | ICD-10-CM | POA: Insufficient documentation

## 2022-09-03 NOTE — Progress Notes (Signed)
Annual physical. Wife died recently of what sounds like sudden cardiac death syndrome.  He has wonderful perspective and is thankful for their almost 60 years together.  Told at the [facility] anemic   Hgb 11.4, normochromic, normocytic.  I question the advice to take iron.    Wanted skin tag removed from left medial thigh.  Done without difficulty.

## 2022-09-03 NOTE — Patient Instructions (Addendum)
It was lovely seeing you Dr. Scarlett Presto!  Today, we discussed your overall health and wellness. I am sorry for the loss of your wife, but glad you have such a good mindset surrounding this.  We will be doing some blood work to evaluate your mild anemia; we will let you know the results of this labwork once it's completed.  Let us know if you notice any redness, swelling, pain, or drainage at the site of your skin tag removal.  Overall, you are doing very well! Keep up the regular exercise and healthy diet. It was a pleasure to work with you today!  Carmelina Dane, MS2 Dr. Madison Hickman

## 2022-09-03 NOTE — Assessment & Plan Note (Signed)
Under good control today at 130/62. Pt reports good diet, regular exercise, and compliance with medications. - Continue current regimen - Will arrange for yearly blood work: CMP, lipid panel, TSH, HA1c

## 2022-09-03 NOTE — Progress Notes (Signed)
    SUBJECTIVE:   CHIEF COMPLAINT / HPI:   Adam Vasquez is a 81 y.o. male who presents today for yearly physical. He has no major concerns today outside of skin tag on his L inner thigh.  Anemia He does report recent labs done by the [place] showed mild anemia with a Hgb of 11.6. He denies headache, fevers, body aches. No blood in urine, stool. No recent weight loss. No CP, SOB, palpitions. No ab pain. No bowel, bladder dysfunction. No weakness, fatigue, light headedness. [facility] supplement, though pt has not started this.  Lost wife suddenly recently. Dealing with this well; he reports a good mindset about this. Alone at home, good support in neighborhood, gets along well with daughters.  Typical diet is breakfast cereal and fruit, occasional lunch of salad, dinner of beans, legumes, vegetables. Not a lot of meat.   Drinks wine occasionally with meals, a couple times a week. No recent tobacco use; prior use in 30s. No recreational drug use.   [place] [facility] [place] [facility] [place] and walking in neighborhood on other days.  PERTINENT  PMH / PSH: HTN  OBJECTIVE:   BP 130/62   Pulse 78   Ht '5\' 10"'$  (1.778 m)   Wt 199 lb (90.3 kg)   SpO2 99%   BMI 28.55 kg/m   General: Pt is well-appearing and seated comfortably in chair. NAD. Cardiovascular: Slow heart rate, regular rhythm. no murmurs, rubs, gallops. Pulmonary: Normal work of breathing. Lungs clear to auscultation bilaterally. Skin: Hyperpigmented skin tag on L medial thigh. Neuro/Psych: A&Ox3. Normal affect.  CBC (08/27/2022): Hgb 11.6; MCV, MCH, MCHC wnl  ASSESSMENT/PLAN:   [facility] showed normocytic anemia with Hgb of 11.6. Some concern for occult bleed given pt's age, though lack of blood in urine/stool or symptoms of anemia reassuring. 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 - Will arrange for bloodwork to further investigate anemia: CBC with diff, ferritin,  reticulocytes, Vit B12 - Pending lab work, will consider colonoscopy  HYPERTENSION, BENIGN SYSTEMIC Under good control today at 130/62. Pt reports good diet, regular exercise, and compliance with medications. - Continue current regimen - Will arrange for yearly blood work: CMP, lipid panel, TSH, HA1c   Skin Tag L medial thigh skin tag removed in clinic today. Risks and benefits of procedure explained. Pt tolerated procedure well. No suspicion for malignancy; no indication to send tag to pathology.  Carmelina Dane, [place]

## 2022-09-03 NOTE — Assessment & Plan Note (Addendum)
[facility] showed normocytic anemia with Hgb of 11.6. Some concern for occult bleed given pt's age, though lack of blood in urine/stool or symptoms of anemia reassuring. 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 - Will arrange for bloodwork to further investigate anemia: CBC with diff, ferritin, reticulocytes, Vit B12 - Pending lab work, will consider colonoscopy

## 2022-09-04 ENCOUNTER — Telehealth: Payer: Self-pay | Admitting: Family Medicine

## 2022-09-04 LAB — CBC WITH DIFFERENTIAL/PLATELET
Basophils Absolute: 0 10*3/uL (ref 0.0–0.2)
Basos: 1 %
EOS (ABSOLUTE): 0.1 10*3/uL (ref 0.0–0.4)
Eos: 2 %
Hematocrit: 35.8 % — ABNORMAL LOW (ref 37.5–51.0)
Hemoglobin: 11.6 g/dL — ABNORMAL LOW (ref 13.0–17.7)
Immature Grans (Abs): 0 10*3/uL (ref 0.0–0.1)
Immature Granulocytes: 0 %
Lymphocytes Absolute: 1.5 10*3/uL (ref 0.7–3.1)
Lymphs: 47 %
MCH: 29.7 pg (ref 26.6–33.0)
MCHC: 32.4 g/dL (ref 31.5–35.7)
MCV: 92 fL (ref 79–97)
Monocytes Absolute: 0.4 10*3/uL (ref 0.1–0.9)
Monocytes: 13 %
Neutrophils Absolute: 1.2 10*3/uL — ABNORMAL LOW (ref 1.4–7.0)
Neutrophils: 37 %
Platelets: 194 10*3/uL (ref 150–450)
RBC: 3.91 x10E6/uL — ABNORMAL LOW (ref 4.14–5.80)
RDW: 11.4 % — ABNORMAL LOW (ref 11.6–15.4)
WBC: 3.3 10*3/uL — ABNORMAL LOW (ref 3.4–10.8)

## 2022-09-04 LAB — CMP14+EGFR
ALT: 24 IU/L (ref 0–44)
AST: 25 IU/L (ref 0–40)
Albumin/Globulin Ratio: 1.5 (ref 1.2–2.2)
Albumin: 4.9 g/dL — ABNORMAL HIGH (ref 3.7–4.7)
Alkaline Phosphatase: 59 IU/L (ref 44–121)
BUN/Creatinine Ratio: 15 (ref 10–24)
BUN: 20 mg/dL (ref 8–27)
Bilirubin Total: 1 mg/dL (ref 0.0–1.2)
CO2: 22 mmol/L (ref 20–29)
Calcium: 9.9 mg/dL (ref 8.6–10.2)
Chloride: 102 mmol/L (ref 96–106)
Creatinine, Ser: 1.3 mg/dL — ABNORMAL HIGH (ref 0.76–1.27)
Globulin, Total: 3.2 g/dL (ref 1.5–4.5)
Glucose: 110 mg/dL — ABNORMAL HIGH (ref 70–99)
Potassium: 4.4 mmol/L (ref 3.5–5.2)
Sodium: 142 mmol/L (ref 134–144)
Total Protein: 8.1 g/dL (ref 6.0–8.5)
eGFR: 55 mL/min/{1.73_m2} — ABNORMAL LOW (ref >59)

## 2022-09-04 LAB — LIPID PANEL
Chol/HDL Ratio: 2.1 ratio (ref 0.0–5.0)
Cholesterol, Total: 185 mg/dL (ref 100–199)
HDL: 87 mg/dL (ref >39)
LDL Chol Calc (NIH): 89 mg/dL (ref 0–99)
Triglycerides: 43 mg/dL (ref 0–149)
VLDL Cholesterol Cal: 9 mg/dL (ref 5–40)

## 2022-09-04 LAB — FERRITIN: Ferritin: 114 ng/mL (ref 30–400)

## 2022-09-04 LAB — HEMOGLOBIN A1C
Est. average glucose Bld gHb Est-mCnc: 126 mg/dL
Hgb A1c MFr Bld: 6 % — ABNORMAL HIGH (ref 4.8–5.6)

## 2022-09-04 LAB — RETICULOCYTES: Retic Ct Pct: 1.6 % (ref 0.6–2.6)

## 2022-09-04 LAB — VITAMIN B12: Vitamin B-12: 801 pg/mL (ref 232–1245)

## 2022-09-04 LAB — TSH: TSH: 1.62 u[IU]/mL (ref 0.450–4.500)

## 2022-09-04 NOTE — Telephone Encounter (Signed)
Called patient. LM.  Labs not complete.  Will call back Monday when all labs back.

## 2022-09-07 ENCOUNTER — Encounter: Payer: Self-pay | Admitting: Family Medicine

## 2022-09-07 NOTE — Telephone Encounter (Signed)
Patient is calling back and would like for Dr. Andria Frames to call back when he gets a chance. He said he will be at home until 1:00pm today.

## 2022-09-08 NOTE — Telephone Encounter (Signed)
Called and discussed.  Recommended repeat Creat and Hgb in 6 months.

## 2022-09-10 ENCOUNTER — Other Ambulatory Visit: Payer: Self-pay | Admitting: Family Medicine

## 2022-09-10 DIAGNOSIS — I10 Essential (primary) hypertension: Secondary | ICD-10-CM

## 2022-09-25 ENCOUNTER — Encounter: Payer: Self-pay | Admitting: Family Medicine

## 2022-11-03 ENCOUNTER — Encounter: Payer: Self-pay | Admitting: Family Medicine

## 2022-11-23 DIAGNOSIS — N5201 Erectile dysfunction due to arterial insufficiency: Secondary | ICD-10-CM | POA: Diagnosis not present

## 2022-11-23 DIAGNOSIS — E291 Testicular hypofunction: Secondary | ICD-10-CM | POA: Diagnosis not present

## 2022-12-13 ENCOUNTER — Other Ambulatory Visit: Payer: Self-pay | Admitting: Family Medicine

## 2022-12-13 DIAGNOSIS — I10 Essential (primary) hypertension: Secondary | ICD-10-CM

## 2022-12-14 ENCOUNTER — Other Ambulatory Visit: Payer: Self-pay | Admitting: Family Medicine

## 2022-12-14 DIAGNOSIS — I10 Essential (primary) hypertension: Secondary | ICD-10-CM

## 2023-01-07 DIAGNOSIS — N5201 Erectile dysfunction due to arterial insufficiency: Secondary | ICD-10-CM | POA: Diagnosis not present

## 2023-01-07 DIAGNOSIS — E291 Testicular hypofunction: Secondary | ICD-10-CM | POA: Diagnosis not present

## 2023-01-22 ENCOUNTER — Telehealth: Payer: Self-pay | Admitting: Family Medicine

## 2023-01-22 NOTE — Telephone Encounter (Signed)
Called patient to schedule Medicare Annual Wellness Visit (AWV). Left message for patient to call back and schedule Medicare Annual Wellness Visit (AWV).  Last date of AWV: 03/22/2014  Please schedule an AWVS appointment at any time with [facility] VISIT.  If any questions, please contact me at [phone].    Thank you,  [facility] Support [facility] Direct dial  [phone]

## 2023-01-27 ENCOUNTER — Telehealth: Payer: Self-pay | Admitting: Family Medicine

## 2023-01-27 NOTE — Telephone Encounter (Signed)
Contacted Adam Vasquez to schedule their annual wellness visit. Appointment made for 02/01/2023.  Thank you,  [facility] Support [facility] Direct dial  [phone]

## 2023-02-01 ENCOUNTER — Ambulatory Visit: Payer: No Typology Code available for payment source

## 2023-02-01 VITALS — Ht 70.0 in | Wt 199.0 lb

## 2023-02-01 DIAGNOSIS — Z0000 Encounter for general adult medical examination without abnormal findings: Secondary | ICD-10-CM

## 2023-02-01 NOTE — Patient Instructions (Signed)
Adam Vasquez , Thank you for taking time to come for your Medicare Wellness Visit. I appreciate your ongoing commitment to your health goals. Please review the following plan we discussed and let me know if I can assist you in the future.   These are the goals we discussed:  Goals      Remain active and independent        This is a list of the screening recommended for you and due dates:  Health Maintenance  Topic Date Due   Flu Shot  04/29/2023   Medicare Annual Wellness Visit  02/01/2024   DTaP/Tdap/Td vaccine (5 - Td or Tdap) 09/02/2031   Pneumonia Vaccine  Completed   COVID-19 Vaccine  Completed   Zoster (Shingles) Vaccine  Completed   HPV Vaccine  Aged Out    Advanced directives: Please bring a copy of your health care power of attorney and living will to the office to be added to your chart at your convenience.   Conditions/risks identified: Aim for 30 minutes of exercise or brisk walking, 6-8 glasses of water, and 5 servings of fruits and vegetables each day.  Next appointment: Follow up in one year for your annual wellness visit.   Preventive Care 61 Years and Older, Male  Preventive care refers to lifestyle choices and visits with your health care provider that can promote health and wellness. What does preventive care include? A yearly physical exam. This is also called an annual well check. Dental exams once or twice a year. Routine eye exams. Ask your health care provider how often you should have your eyes checked. Personal lifestyle choices, including: Daily care of your teeth and gums. Regular physical activity. Eating a healthy diet. Avoiding tobacco and drug use. Limiting alcohol use. Practicing safe sex. Taking low doses of aspirin every day. Taking vitamin and mineral supplements as recommended by your health care provider. What happens during an annual well check? The services and screenings done by your health care provider during your annual well  check will depend on your age, overall health, lifestyle risk factors, and family history of disease. Counseling  Your health care provider may ask you questions about your: Alcohol use. Tobacco use. Drug use. Emotional well-being. Home and relationship well-being. Sexual activity. Eating habits. History of falls. Memory and ability to understand (cognition). Work and work Astronomer. Screening  You may have the following tests or measurements: Height, weight, and BMI. Blood pressure. Lipid and cholesterol levels. These may be checked every 5 years, or more frequently if you are over 51 years old. Skin check. Lung cancer screening. You may have this screening every year starting at age 74 if you have a 30-pack-year history of smoking and currently smoke or have quit within the past 15 years. Fecal occult blood test (FOBT) of the stool. You may have this test every year starting at age 61. Flexible sigmoidoscopy or colonoscopy. You may have a sigmoidoscopy every 5 years or a colonoscopy every 10 years starting at age 85. Prostate cancer screening. Recommendations will vary depending on your family history and other risks. Hepatitis C blood test. Hepatitis B blood test. Sexually transmitted disease (STD) testing. Diabetes screening. This is done by checking your blood sugar (glucose) after you have not eaten for a while (fasting). You may have this done every 1-3 years. Abdominal aortic aneurysm (AAA) screening. You may need this if you are a current or former smoker. Osteoporosis. You may be screened starting at age 52 if  you are at high risk. Talk with your health care provider about your test results, treatment options, and if necessary, the need for more tests. Vaccines  Your health care provider may recommend certain vaccines, such as: Influenza vaccine. This is recommended every year. Tetanus, diphtheria, and acellular pertussis (Tdap, Td) vaccine. You may need a Td booster  every 10 years. Zoster vaccine. You may need this after age 49. Pneumococcal 13-valent conjugate (PCV13) vaccine. One dose is recommended after age 25. Pneumococcal polysaccharide (PPSV23) vaccine. One dose is recommended after age 49. Talk to your health care provider about which screenings and vaccines you need and how often you need them. This information is not intended to replace advice given to you by your health care provider. Make sure you discuss any questions you have with your health care provider. Document Released: 10/11/2015 Document Revised: 06/03/2016 Document Reviewed: 07/16/2015 Elsevier Interactive Patient Education  2017 [place] Prevention in the Home Falls can cause injuries. They can happen to people of all ages. There are many things you can do to make your home safe and to help prevent falls. What can I do on the outside of my home? Regularly fix the edges of walkways and driveways and fix any cracks. Remove anything that might make you trip as you walk through a door, such as a raised step or threshold. Trim any bushes or trees on the path to your home. Use bright outdoor lighting. Clear any walking paths of anything that might make someone trip, such as rocks or tools. Regularly check to see if handrails are loose or broken. Make sure that both sides of any steps have handrails. Any raised decks and porches should have guardrails on the edges. Have any leaves, snow, or ice cleared regularly. Use sand or salt on walking paths during winter. Clean up any spills in your garage right away. This includes oil or grease spills. What can I do in the bathroom? Use night lights. Install grab bars by the toilet and in the tub and shower. Do not use towel bars as grab bars. Use non-skid mats or decals in the tub or shower. If you need to sit down in the shower, use a plastic, non-slip stool. Keep the floor dry. Clean up any water that spills on the floor as soon  as it happens. Remove soap buildup in the tub or shower regularly. Attach bath mats securely with double-sided non-slip rug tape. Do not have throw rugs and other things on the floor that can make you trip. What can I do in the bedroom? Use night lights. Make sure that you have a light by your bed that is easy to reach. Do not use any sheets or blankets that are too big for your bed. They should not hang down onto the floor. Have a firm chair that has side arms. You can use this for support while you get dressed. Do not have throw rugs and other things on the floor that can make you trip. What can I do in the kitchen? Clean up any spills right away. Avoid walking on wet floors. Keep items that you use a lot in easy-to-reach places. If you need to reach something above you, use a strong step stool that has a grab bar. Keep electrical cords out of the way. Do not use floor polish or wax that makes floors slippery. If you must use wax, use non-skid floor wax. Do not have throw rugs and other things on  the floor that can make you trip. What can I do with my stairs? Do not leave any items on the stairs. Make sure that there are handrails on both sides of the stairs and use them. Fix handrails that are broken or loose. Make sure that handrails are as long as the stairways. Check any carpeting to make sure that it is firmly attached to the stairs. Fix any carpet that is loose or worn. Avoid having throw rugs at the top or bottom of the stairs. If you do have throw rugs, attach them to the floor with carpet tape. Make sure that you have a light switch at the top of the stairs and the bottom of the stairs. If you do not have them, ask someone to add them for you. What else can I do to help prevent falls? Wear shoes that: Do not have high heels. Have rubber bottoms. Are comfortable and fit you well. Are closed at the toe. Do not wear sandals. If you use a stepladder: Make sure that it is fully  opened. Do not climb a closed stepladder. Make sure that both sides of the stepladder are locked into place. Ask someone to hold it for you, if possible. Clearly mark and make sure that you can see: Any grab bars or handrails. First and last steps. Where the edge of each step is. Use tools that help you move around (mobility aids) if they are needed. These include: Canes. Walkers. Scooters. Crutches. Turn on the lights when you go into a dark area. Replace any light bulbs as soon as they burn out. Set up your furniture so you have a clear path. Avoid moving your furniture around. If any of your floors are uneven, fix them. If there are any pets around you, be aware of where they are. Review your medicines with your doctor. Some medicines can make you feel dizzy. This can increase your chance of falling. Ask your doctor what other things that you can do to help prevent falls. This information is not intended to replace advice given to you by your health care provider. Make sure you discuss any questions you have with your health care provider. Document Released: 07/11/2009 Document Revised: 02/20/2016 Document Reviewed: 10/19/2014 Elsevier Interactive Patient Education  2017 [employer].

## 2023-02-01 NOTE — Progress Notes (Signed)
Subjective:   Adam Vasquez is a 82 y.o. male who presents for an Initial Medicare Annual Wellness Visit.  I connected with  Adam Vasquez on 02/01/23 by a audio enabled telemedicine application and verified that I am speaking with the correct person using two identifiers.  Patient Location: Home  Provider Location: Home Office  I discussed the limitations of evaluation and management by telemedicine. The patient expressed understanding and agreed to proceed.  Review of Systems     Cardiac Risk Factors include: advanced age (>73men, >78 women);hypertension;male gender     Objective:    Today's Vitals   02/01/23 1622  Weight: 199 lb (90.3 kg)  Height: 5\' 10"  (1.778 m)   Body mass index is 28.55 kg/m.     02/01/2023    4:25 PM 09/03/2022    8:37 AM 02/02/2022    8:45 AM 05/05/2021    8:28 AM 03/24/2021   10:19 AM 08/19/2020    8:25 AM 07/27/2019    9:36 AM  Advanced Directives  Does Patient Have a Medical Advance Directive? Yes No Yes No No No No  Type of Advance Directive Living will  Healthcare Power of [place];Living will      Does patient want to make changes to medical advance directive? No - Patient declined  Yes (ED - send information to MyChart)      Copy of Healthcare Power of Attorney in Chart?   Yes - validated most recent copy scanned in chart (See row information)      Would patient like information on creating a medical advance directive?  No - Patient declined  No - Patient declined No - Patient declined No - Patient declined No - Patient declined    Current Medications (verified) Outpatient Encounter Medications as of 02/01/2023  Medication Sig   Ascorbic Acid (VITAMIN C) 500 MG tablet Take 500 mg by mouth daily.     Cholecalciferol (VITAMIN D3 PO) Take by mouth.   hydrochlorothiazide (HYDRODIURIL) 25 MG tablet Take 1 tablet by mouth once daily   latanoprost (XALATAN) 0.005 % ophthalmic solution Apply 1 drop to eye daily.   lisinopril (ZESTRIL) 20 MG  tablet Take 2 tablets by mouth once daily   loratadine (CLARITIN) 10 MG tablet Take 10 mg by mouth daily as needed.    Omega-3 Fatty Acids (RA FISH OIL) 1000 MG CAPS Take 3 capsules by mouth once daily   No facility-administered encounter medications on file as of 02/01/2023.    Allergies (verified) Amlodipine   History: Past Medical History:  Diagnosis Date   Allergy    Hypertension    No past surgical history on file. Family History  Problem Relation Age of Onset   Diabetes Mother    Heart disease Mother    Hypertension Mother    Diabetes Father    Other Neg Hx    Social History   Socioeconomic History   Marital status: Widowed    Spouse name: Adam Vasquez   Number of children: 2   Years of education: 18   Highest education level: Not on file  Occupational History   Occupation: Pyschologist  Tobacco Use   Smoking status: Former    Types: Cigarettes    Quit date: 05/18/1976    Years since quitting: 46.7   Smokeless tobacco: Never  Substance and Sexual Activity   Alcohol use: Yes    Alcohol/week: 1.0 standard drink of alcohol    Types: 1 Standard drinks or equivalent per week  Drug use: No   Sexual activity: Yes  Other Topics Concern   Not on file  Social History Narrative   Health Care POA: Marquavion Zenk, wife    Emergency Contact: [place] or daughter Adam Vasquez (c) [phone], (h) [phone]   End of Life Plan: pt reports having living will. Pt will bring a copy to their next visit.   Who lives with you: wife   Any pets: none   Diet: Pt has a varied diet and focuses on fruits and vegetables.  Pt reports not snacking any longer.   Exercise: Pt swims, runs, and does weight resistance 3-4 times a week.   Seatbelts: Pt reports wearing seatbelt when in vehicle.   Wynelle Link Exposure/Protection: Pt reports wearing hats when he is running outside and wearing sunscreen at the beach.    Hobbies: running, music, travel, gardening               Social  Determinants of Health   Financial Resource Strain: Low Risk  (02/01/2023)   Overall Financial Resource Strain (CARDIA)    Difficulty of Paying Living Expenses: Not hard at all  Food Insecurity: No Food Insecurity (02/01/2023)   Hunger Vital Sign    Worried About Running Out of Food in the Last Year: Never true    Ran Out of Food in the Last Year: Never true  Transportation Needs: No Transportation Needs (02/01/2023)   PRAPARE - Administrator, Civil Service (Medical): No    Lack of Transportation (Non-Medical): No  Physical Activity: Sufficiently Active (02/01/2023)   Exercise Vital Sign    Days of Exercise per Week: 6 days    Minutes of Exercise per Session: 60 min  Stress: No Stress Concern Present (02/01/2023)   [employer] of Occupational Health - Occupational Stress Questionnaire    Feeling of Stress : Not at all  Social Connections: Moderately Integrated (02/01/2023)   Social Connection and Isolation Panel [NHANES]    Frequency of Communication with Friends and Family: More than three times a week    Frequency of Social Gatherings with Friends and Family: More than three times a week    Attends Religious Services: More than 4 times per year    Active Member of [employer] or Organizations: Yes    Attends Banker Meetings: More than 4 times per year    Marital Status: Widowed    Tobacco Counseling Counseling given: Not Answered   Clinical Intake:  Pre-visit preparation completed: Yes  Pain : No/denies pain  Diabetes: No  How often do you need to have someone help you when you read instructions, pamphlets, or other written materials from your doctor or pharmacy?: 1 - Never  Diabetic?No   Interpreter Needed?: No  Information entered by :: Kandis Fantasia LPN   Activities of Daily Living    02/01/2023    4:25 PM  In your present state of health, do you have any difficulty performing the following activities:  Hearing? 0  Vision? 0  Difficulty  concentrating or making decisions? 0  Walking or climbing stairs? 0  Dressing or bathing? 0  Doing errands, shopping? 0  Preparing Food and eating ? N  Using the Toilet? N  In the past six months, have you accidently leaked urine? N  Do you have problems with loss of bowel control? N  Managing your Medications? N  Managing your Finances? N  Housekeeping or managing your Housekeeping? N    Patient Care Team: [place],  Milus Mallick, MD as PCP - General (Family Medicine) [name], MD (Ophthalmology) Clinic, [facility]  Indicate any recent Medical Services you may have received from other than [facility] providers in the past year (date may be approximate).     Assessment:   This is a routine wellness examination for Gabryel.  Hearing/Vision screen Hearing Screening - Comments:: Denies hearing difficulties   Vision Screening - Comments:: Wears rx glasses - up to date with routine eye exams with [facility]    Dietary issues and exercise activities discussed: Current Exercise Habits: Home exercise routine, Type of exercise: stretching;walking;strength training/weights, Time (Minutes): 30, Frequency (Times/Week): 6, Weekly Exercise (Minutes/Week): 180, Intensity: Moderate   Goals Addressed             This Visit's Progress    Remain active and independent        Depression Screen    02/01/2023    4:24 PM 09/03/2022    8:37 AM 02/02/2022    8:55 AM 09/01/2021    8:47 AM 05/05/2021    8:40 AM 03/24/2021   10:19 AM 08/19/2020    8:36 AM  PHQ 2/9 Scores  PHQ - 2 Score 0 0 0 0 0 0 0  PHQ- 9 Score  0 0 0 1 0 0    Fall Risk    02/01/2023    4:23 PM 05/05/2021    8:39 AM 07/27/2019    9:36 AM 07/25/2018    4:29 PM 07/25/2018    4:14 PM  Fall Risk   Falls in the past year? 0 0 0 No No  Number falls in past yr: 0      Injury with Fall? 0      Risk for fall due to : No Fall Risks      Follow up Falls prevention discussed;Education provided;Falls evaluation completed         FALL RISK PREVENTION PERTAINING TO THE HOME:  Any stairs in or around the home? Yes  If so, are there any without handrails? No  Home free of loose throw rugs in walkways, pet beds, electrical cords, etc? Yes  Adequate lighting in your home to reduce risk of falls? Yes   ASSISTIVE DEVICES UTILIZED TO PREVENT FALLS:  Life alert? No  Use of a cane, walker or w/c? No  Grab bars in the bathroom? Yes  Shower chair or bench in shower? No  Elevated toilet seat or a handicapped toilet? Yes   TIMED UP AND GO:  Was the test performed? No . Telephonic visit   Cognitive Function:    03/22/2014   11:00 AM 06/28/2012    5:00 PM 05/19/2011    2:00 PM  MMSE - Mini Mental State Exam  Orientation to time 4 5 5   Orientation to Place 5 5 5   Registration 3 3 3   Attention/ Calculation 5 5 5   Recall 3 3 3   Language- name 2 objects 2 2 2   Language- repeat 1 1 1   Language- follow 3 step command 3 3 3   Language- read & follow direction 1 1 1   Write a sentence 1 1 1   Copy design 0 1 1  Total score 28 30 30         02/01/2023    4:26 PM  6CIT Screen  What Year? 0 points  What month? 0 points  What time? 0 points  Count back from 20 0 points  Months in reverse 0 points  Repeat phrase 0 points  Total Score 0 points    Immunizations Immunization History  Administered Date(s) Administered   COVID-19, mRNA, vaccine(Comirnaty)12 years and older 06/17/2022   Influenza Split 07/29/2013   Influenza Whole 07/10/2008, 08/07/2009, 07/05/2010   Influenza, High Dose Seasonal PF 06/18/2011, 06/28/2014, 06/28/2016, 06/28/2017, 06/28/2018, 06/13/2019   Influenza,inj,Quad PF,6+ Mos 06/28/2014   Influenza-Unspecified 06/29/2007, 06/28/2009, 06/28/2012, 07/18/2015, 06/29/2017, 06/28/2018, 07/16/2019, 06/24/2020, 06/17/2022   PFIZER Comirnaty(Gray Top)Covid-19 Tri-Sucrose Vaccine 11/21/2019, 12/12/2019, 06/24/2020, 12/27/2020   Pfizer Covid-19 Vaccine Bivalent Booster 76yrs & up 06/25/2021,  01/29/2022   Pneumococcal Conjugate-13 07/29/2013, 08/23/2013   Pneumococcal Polysaccharide-23 07/29/2001, 06/25/2009   Pneumococcal-Unspecified 06/29/2007   Td 07/29/2001   Tdap 06/28/2008, 08/12/2011, 09/01/2021   Zoster Recombinat (Shingrix) 07/19/2017, 12/06/2017, 07/19/2018, 09/28/2020   Zoster, Live 08/12/2005    TDAP status: Up to date  Flu Vaccine status: Up to date  Pneumococcal vaccine status: Up to date  Covid-19 vaccine status: Information provided on how to obtain vaccines.   Qualifies for Shingles Vaccine? Yes   Zostavax completed Yes   Shingrix Completed?: Yes  Screening Tests Health Maintenance  Topic Date Due   INFLUENZA VACCINE  04/29/2023   Medicare Annual Wellness (AWV)  02/01/2024   DTaP/Tdap/Td (5 - Td or Tdap) 09/02/2031   Pneumonia Vaccine 28+ Years old  Completed   COVID-19 Vaccine  Completed   Zoster Vaccines- Shingrix  Completed   HPV VACCINES  Aged Out    Health Maintenance  There are no preventive care reminders to display for this patient.   Colorectal cancer screening: No longer required.   Lung Cancer Screening: (Low Dose CT Chest recommended if Age 31-80 years, 30 pack-year currently smoking OR have quit w/in 15years.) does not qualify.   Lung Cancer Screening Referral: n/a  Additional Screening:  Hepatitis C Screening: does not qualify  Vision Screening: Recommended annual ophthalmology exams for early detection of glaucoma and other disorders of the eye. Is the patient up to date with their annual eye exam?  Yes  Who is the provider or what is the name of the office in which the patient attends annual eye exams? [facility] If pt is not established with a provider, would they like to be referred to a provider to establish care? No .   Dental Screening: Recommended annual dental exams for proper oral hygiene  Community Resource Referral / Chronic Care Management: CRR required this visit?  No   CCM required this visit?   No      Plan:     I have personally reviewed and noted the following in the patient's chart:   Medical and social history Use of alcohol, tobacco or illicit drugs  Current medications and supplements including opioid prescriptions. Patient is not currently taking opioid prescriptions. Functional ability and status Nutritional status Physical activity Advanced directives List of other physicians Hospitalizations, surgeries, and ER visits in previous 12 months Vitals Screenings to include cognitive, depression, and falls Referrals and appointments  In addition, I have reviewed and discussed with patient certain preventive protocols, quality metrics, and best practice recommendations. A written personalized care plan for preventive services as well as general preventive health recommendations were provided to patient.     Durwin Nora, [place]   10/03/1094   Due to this being a virtual visit, the after visit summary with patients personalized plan was offered to patient via mail or my-chart. per request, patient was mailed a copy of AVS  Nurse Notes: No concerns

## 2023-02-11 DIAGNOSIS — N5201 Erectile dysfunction due to arterial insufficiency: Secondary | ICD-10-CM | POA: Diagnosis not present

## 2023-03-10 ENCOUNTER — Other Ambulatory Visit: Payer: Self-pay | Admitting: Family Medicine

## 2023-03-10 DIAGNOSIS — I10 Essential (primary) hypertension: Secondary | ICD-10-CM

## 2023-03-11 DIAGNOSIS — N5201 Erectile dysfunction due to arterial insufficiency: Secondary | ICD-10-CM | POA: Diagnosis not present

## 2023-03-11 DIAGNOSIS — E291 Testicular hypofunction: Secondary | ICD-10-CM | POA: Diagnosis not present

## 2023-04-08 ENCOUNTER — Other Ambulatory Visit: Payer: Self-pay

## 2023-04-08 DIAGNOSIS — I10 Essential (primary) hypertension: Secondary | ICD-10-CM

## 2023-04-08 MED ORDER — LISINOPRIL 20 MG PO TABS: 40.0000 mg | ORAL_TABLET | Freq: Every day | ORAL | 0 refills | Status: DC

## 2023-04-22 DIAGNOSIS — E291 Testicular hypofunction: Secondary | ICD-10-CM | POA: Diagnosis not present

## 2023-04-22 DIAGNOSIS — N5201 Erectile dysfunction due to arterial insufficiency: Secondary | ICD-10-CM | POA: Diagnosis not present

## 2023-04-27 ENCOUNTER — Ambulatory Visit: Payer: No Typology Code available for payment source | Admitting: Family Medicine

## 2023-05-10 ENCOUNTER — Other Ambulatory Visit: Payer: Self-pay

## 2023-05-10 ENCOUNTER — Ambulatory Visit (INDEPENDENT_AMBULATORY_CARE_PROVIDER_SITE_OTHER): Payer: No Typology Code available for payment source | Admitting: Family Medicine

## 2023-05-10 ENCOUNTER — Encounter: Payer: Self-pay | Admitting: Family Medicine

## 2023-05-10 VITALS — BP 132/72 | HR 67 | Ht 70.0 in | Wt 192.2 lb

## 2023-05-10 DIAGNOSIS — R7989 Other specified abnormal findings of blood chemistry: Secondary | ICD-10-CM | POA: Diagnosis not present

## 2023-05-10 DIAGNOSIS — R739 Hyperglycemia, unspecified: Secondary | ICD-10-CM | POA: Diagnosis not present

## 2023-05-10 DIAGNOSIS — D649 Anemia, unspecified: Secondary | ICD-10-CM

## 2023-05-10 DIAGNOSIS — M5430 Sciatica, unspecified side: Secondary | ICD-10-CM | POA: Insufficient documentation

## 2023-05-10 DIAGNOSIS — R7303 Prediabetes: Secondary | ICD-10-CM | POA: Diagnosis not present

## 2023-05-10 DIAGNOSIS — M5432 Sciatica, left side: Secondary | ICD-10-CM

## 2023-05-10 DIAGNOSIS — I10 Essential (primary) hypertension: Secondary | ICD-10-CM

## 2023-05-10 MED ORDER — HYDROCHLOROTHIAZIDE 25 MG PO TABS: 25.0000 mg | ORAL_TABLET | Freq: Every day | ORAL | 3 refills | Status: DC

## 2023-05-10 MED ORDER — LISINOPRIL 20 MG PO TABS: 40.0000 mg | ORAL_TABLET | Freq: Every day | ORAL | 3 refills | Status: DC

## 2023-05-10 NOTE — Assessment & Plan Note (Signed)
Unclear if true sciatic nerve pain given description. Currently asymptomatic and exam wnl. Continue conservative therapy, RTC if worsens.

## 2023-05-10 NOTE — Progress Notes (Signed)
    SUBJECTIVE:   CHIEF COMPLAINT / HPI:   Hypertension: - Medications: hydrochlorothiazide, lisinopril - Compliance: good - Checking BP at home: no - Denies any SOB, CP, vision changes, LE edema, medication SEs, or symptoms of hypotension  Anemia - h/o low iron, taking iron supplements.   Low Testosterone - seeing Endo, Dr. Fransico Michael at [facility].   Sciactic pain - L side. Intermittent. Most recently noticed this morning after working out at [employer]. Takes Ibuprofen prn. Has not taken anything today. No pain currently. Described as "dullness"   OBJECTIVE:   BP 132/72   Pulse 67   Ht 5\' 10"  (1.778 m)   Wt 192 lb 3.2 oz (87.2 kg)   SpO2 97%   BMI 27.58 kg/m   Gen: well appearing, in NAD Card: RRR Lungs: CTAB MSK: no tenderness over piriformis Ext: WWP, no edema   ASSESSMENT/PLAN:   HYPERTENSION, BENIGN SYSTEMIC Doing well on current regimen, no changes made today.  Low testosterone Continue to follow with Endo  Anemia Recheck labs.  Prediabetes Recheck A1c.  Sciatic pain Unclear if true sciatic nerve pain given description. Currently asymptomatic and exam wnl. Continue conservative therapy, RTC if worsens.     [name], DO

## 2023-05-10 NOTE — Assessment & Plan Note (Signed)
Recheck A1c 

## 2023-05-10 NOTE — Assessment & Plan Note (Signed)
Continue to follow with Endo

## 2023-05-10 NOTE — Patient Instructions (Signed)
It was great to see you!  Our plans for today:  - We sent refills to your pharmacy. No medication changes today.  - We are checking some labs today, we will send you a letter with these results. - Come back in 1 year, sooner if you need [place]!  Take care and seek immediate care sooner if you develop any concerns.   Dr. Linwood Dibbles

## 2023-05-10 NOTE — Assessment & Plan Note (Signed)
Recheck labs 

## 2023-05-10 NOTE — Assessment & Plan Note (Signed)
Doing well on current regimen, no changes made today. 

## 2023-05-11 ENCOUNTER — Encounter: Payer: Self-pay | Admitting: Family Medicine

## 2023-06-15 DIAGNOSIS — Z6826 Body mass index (BMI) 26.0-26.9, adult: Secondary | ICD-10-CM | POA: Diagnosis not present

## 2023-06-15 DIAGNOSIS — I77811 Abdominal aortic ectasia: Secondary | ICD-10-CM | POA: Diagnosis not present

## 2023-06-15 DIAGNOSIS — Z008 Encounter for other general examination: Secondary | ICD-10-CM | POA: Diagnosis not present

## 2023-06-15 DIAGNOSIS — N1831 Chronic kidney disease, stage 3a: Secondary | ICD-10-CM | POA: Diagnosis not present

## 2023-06-15 DIAGNOSIS — E663 Overweight: Secondary | ICD-10-CM | POA: Diagnosis not present

## 2023-06-15 DIAGNOSIS — R7303 Prediabetes: Secondary | ICD-10-CM | POA: Diagnosis not present

## 2023-07-16 ENCOUNTER — Other Ambulatory Visit: Payer: Self-pay

## 2023-07-16 ENCOUNTER — Ambulatory Visit (INDEPENDENT_AMBULATORY_CARE_PROVIDER_SITE_OTHER): Payer: No Typology Code available for payment source | Admitting: Family Medicine

## 2023-07-16 VITALS — BP 118/70 | HR 64 | Wt 194.4 lb

## 2023-07-16 DIAGNOSIS — I10 Essential (primary) hypertension: Secondary | ICD-10-CM | POA: Diagnosis not present

## 2023-07-16 DIAGNOSIS — M10072 Idiopathic gout, left ankle and foot: Secondary | ICD-10-CM

## 2023-07-16 MED ORDER — PREDNISONE 50 MG PO TABS: 50.0000 mg | ORAL_TABLET | Freq: Every day | ORAL | 0 refills | Status: DC

## 2023-07-16 MED ORDER — HYDRALAZINE HCL 10 MG PO TABS: 10.0000 mg | ORAL_TABLET | Freq: Two times a day (BID) | ORAL | 0 refills | Status: DC

## 2023-07-16 NOTE — Progress Notes (Unsigned)
    SUBJECTIVE:   CHIEF COMPLAINT / HPI:   Foot  Went to a concert and was standing and noticed his foot was hurting. At home was icing it. This morning he woke up with a red knot on his foot. Never happened before.  Mentions that before the concert ate a meal with his friends that heavily contained meat.  Patient was concerned that it was gout.  Denies any fevers.  PERTINENT  PMH / PSH: HTN, Anemia, CAD, Prediabetes, OA  OBJECTIVE:   BP 118/70   Pulse 64   Wt 194 lb 6.4 oz (88.2 kg)   SpO2 100%   BMI 27.89 kg/m   General: well appearing, in no acute distress CV: RRR, radial pulses equal and palpable Resp: Normal work of breathing on room air, CTAB Neuro: Alert & Oriented  Foot: Left foot with erythematous and edematous MTP joint with some edema extending from joint, MTP joint is warm, right foot with bunion but otherwise no edema or erythema.  DPs palpable on both feet.  No ulceration or excoriation around edematous area of left foot.   ASSESSMENT/PLAN:   Assessment & Plan Acute idiopathic gout involving toe of left foot Most likely patient has first incident of gout.  Exacerbating factors were consumption of meat, as well as hydrochlorothiazide patient was on. 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 - Follow-up with PCP and obtain uric acid level at that time to treat gout to goal. HYPERTENSION, BENIGN SYSTEMIC Controlled at this time however we will have to change medications as hydrochlorothiazide was discontinued due to acute gout. - Discontinue hydrochlorothiazide and start hydralazine 10 mg twice daily.  Could not start amlodipine due to patient's allergy and avoiding beta-blocker as patient has borderline low heart rate.  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 - BMP -Follow-up with PCP in 2 to 3 weeks      Lockie Mola, MD [facility]

## 2023-07-16 NOTE — Patient Instructions (Addendum)
It was wonderful to see you today.  Please bring ALL of your medications with you to every visit.   Today we talked about:  Foot - You most likely have gout. You diagnosed it yourself! To treat this acute gout flare we will treat with a 5 day steroid (prednisone) course.   For your blood pressure - We are switching you from hydrochlorothiazide to Hydralazine twice a day.   We will follow up for your blood pressure and labs.   Thank you for choosing [facility].   Please call [phone] with any questions about today's appointment.  Please be sure to schedule follow up at the front desk before you leave today.   Lockie Mola, MD  Family Medicine

## 2023-07-17 LAB — BASIC METABOLIC PANEL
BUN/Creatinine Ratio: 17 (ref 10–24)
BUN: 20 mg/dL (ref 8–27)
CO2: 21 mmol/L (ref 20–29)
Calcium: 9.7 mg/dL (ref 8.6–10.2)
Chloride: 99 mmol/L (ref 96–106)
Creatinine, Ser: 1.17 mg/dL (ref 0.76–1.27)
Glucose: 98 mg/dL (ref 70–99)
Potassium: 4.2 mmol/L (ref 3.5–5.2)
Sodium: 140 mmol/L (ref 134–144)
eGFR: 63 mL/min/{1.73_m2} (ref >59)

## 2023-07-19 ENCOUNTER — Encounter: Payer: Self-pay | Admitting: Family Medicine

## 2023-07-19 NOTE — Assessment & Plan Note (Signed)
Controlled at this time however we will have to change medications as hydrochlorothiazide was discontinued due to acute gout. - Discontinue hydrochlorothiazide and start hydralazine 10 mg twice daily.  Could not start amlodipine due to patient's allergy and avoiding beta-blocker as patient has borderline low heart rate.  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 - BMP -Follow-up with PCP in 2 to 3 weeks

## 2023-07-22 ENCOUNTER — Encounter: Payer: Self-pay | Admitting: Family Medicine

## 2023-07-23 ENCOUNTER — Other Ambulatory Visit: Payer: Self-pay | Admitting: Family Medicine

## 2023-07-23 DIAGNOSIS — I10 Essential (primary) hypertension: Secondary | ICD-10-CM

## 2023-07-29 DIAGNOSIS — E291 Testicular hypofunction: Secondary | ICD-10-CM | POA: Diagnosis not present

## 2023-08-05 ENCOUNTER — Encounter: Payer: Self-pay | Admitting: Family Medicine

## 2023-08-05 ENCOUNTER — Ambulatory Visit (INDEPENDENT_AMBULATORY_CARE_PROVIDER_SITE_OTHER): Payer: No Typology Code available for payment source | Admitting: Family Medicine

## 2023-08-05 VITALS — BP 138/72 | HR 53 | Temp 97.6°F | Ht 70.0 in | Wt 198.6 lb

## 2023-08-05 DIAGNOSIS — Z8739 Personal history of other diseases of the musculoskeletal system and connective tissue: Secondary | ICD-10-CM

## 2023-08-05 DIAGNOSIS — D649 Anemia, unspecified: Secondary | ICD-10-CM | POA: Diagnosis not present

## 2023-08-05 DIAGNOSIS — I10 Essential (primary) hypertension: Secondary | ICD-10-CM

## 2023-08-05 DIAGNOSIS — M109 Gout, unspecified: Secondary | ICD-10-CM

## 2023-08-05 HISTORY — DX: Personal history of other diseases of the musculoskeletal system and connective tissue: Z87.39

## 2023-08-05 MED ORDER — HYDROCHLOROTHIAZIDE 25 MG PO TABS: 25.0000 mg | ORAL_TABLET | Freq: Every day | ORAL | Status: DC

## 2023-08-05 NOTE — Progress Notes (Signed)
   SUBJECTIVE:   CHIEF COMPLAINT / HPI:   Hypertension: - Medications: hydralazine, lisinopril. Taken off hydrochlorothiazide at last visit due to gout concerns. - Compliance: good - Checking BP at home: no - Has noticed increase in LE edema that is concerning to him since coming off hydrochlorothiazide. Has been on hydrochlorothiazide for ~20 years with no prior episode of gout.  Anemia - notes he doesn't eat much meat. Take iron pill every other day.   OBJECTIVE:   BP 138/72   Pulse (!) 53   Temp 97.6 F (36.4 C)   Ht 5\' 10"  (1.778 m)   Wt 198 lb 9.6 oz (90.1 kg)   SpO2 96%   BMI 28.50 kg/m   Gen: well appearing, in NAD Card: RRR Lungs: CTAB Ext: WWP, trace ankle edema, +DP pulses.   ASSESSMENT/PLAN:   HYPERTENSION, BENIGN SYSTEMIC At goal for age on current regimen. Discussed extensively with patient. Given trace edema is of concern, only one gout flare in the 20+ years he has been on hydrochlorothiazide, patient prefers to return to previous BP regimen and monitor for symptoms of recurrent gout. Will f/u in 1 month to assess BP control and potential symptoms. Obtaining labs today.  Anemia Perhaps due to mild CKD though recent Cr improved. Obtain BMP, CBC, iron panel, B12 today.  Gout Resolved s/p steroids. Obtain uric acid today.     [name], DO

## 2023-08-05 NOTE — Assessment & Plan Note (Signed)
Resolved s/p steroids. Obtain uric acid today.

## 2023-08-05 NOTE — Patient Instructions (Addendum)
It was great to see you!  Our plans for today:  - Start back the hydrochlorothiazide tomorrow. Stop the hydralazine tonight. - Come back in about 1 month for your annual physical.  We are checking some labs today, we will release these results to your MyChart.  Take care and seek immediate care sooner if you develop any concerns.   Dr. Linwood Dibbles

## 2023-08-05 NOTE — Assessment & Plan Note (Addendum)
Perhaps due to mild CKD though recent Cr improved. Obtain BMP, CBC, iron panel, B12 today.

## 2023-08-05 NOTE — Assessment & Plan Note (Signed)
At goal for age on current regimen. Discussed extensively with patient. Given trace edema is of concern, only one gout flare in the 20+ years he has been on hydrochlorothiazide, patient prefers to return to previous BP regimen and monitor for symptoms of recurrent gout. Will f/u in 1 month to assess BP control and potential symptoms. Obtaining labs today.

## 2023-08-06 ENCOUNTER — Encounter: Payer: Self-pay | Admitting: Family Medicine

## 2023-08-06 LAB — BASIC METABOLIC PANEL
BUN/Creatinine Ratio: 17 (ref 10–24)
BUN: 18 mg/dL (ref 8–27)
CO2: 22 mmol/L (ref 20–29)
Calcium: 9.5 mg/dL (ref 8.6–10.2)
Chloride: 102 mmol/L (ref 96–106)
Creatinine, Ser: 1.08 mg/dL (ref 0.76–1.27)
Glucose: 117 mg/dL — ABNORMAL HIGH (ref 70–99)
Potassium: 4.3 mmol/L (ref 3.5–5.2)
Sodium: 140 mmol/L (ref 134–144)
eGFR: 69 mL/min/{1.73_m2} (ref >59)

## 2023-08-06 LAB — CBC
Hematocrit: 38.5 % (ref 37.5–51.0)
Hemoglobin: 12 g/dL — ABNORMAL LOW (ref 13.0–17.7)
MCH: 29.9 pg (ref 26.6–33.0)
MCHC: 31.2 g/dL — ABNORMAL LOW (ref 31.5–35.7)
MCV: 96 fL (ref 79–97)
Platelets: 211 10*3/uL (ref 150–450)
RBC: 4.01 x10E6/uL — ABNORMAL LOW (ref 4.14–5.80)
RDW: 11.8 % (ref 11.6–15.4)
WBC: 3.8 10*3/uL (ref 3.4–10.8)

## 2023-08-06 LAB — VITAMIN B12: Vitamin B-12: 598 pg/mL (ref 232–1245)

## 2023-08-06 LAB — IRON,TIBC AND FERRITIN PANEL
Ferritin: 118 ng/mL (ref 30–400)
Iron Saturation: 28 % (ref 15–55)
Iron: 68 ug/dL (ref 38–169)
Total Iron Binding Capacity: 242 ug/dL — ABNORMAL LOW (ref 250–450)
UIBC: 174 ug/dL (ref 111–343)

## 2023-08-07 LAB — SPECIMEN STATUS REPORT

## 2023-08-07 LAB — URIC ACID: Uric Acid: 6 mg/dL (ref 3.8–8.4)

## 2023-09-06 ENCOUNTER — Ambulatory Visit (INDEPENDENT_AMBULATORY_CARE_PROVIDER_SITE_OTHER): Payer: No Typology Code available for payment source | Admitting: Family Medicine

## 2023-09-06 ENCOUNTER — Encounter: Payer: Self-pay | Admitting: Family Medicine

## 2023-09-06 VITALS — BP 130/80 | HR 95 | Ht 70.0 in | Wt 194.0 lb

## 2023-09-06 DIAGNOSIS — Z0000 Encounter for general adult medical examination without abnormal findings: Secondary | ICD-10-CM | POA: Diagnosis not present

## 2023-09-06 DIAGNOSIS — R7303 Prediabetes: Secondary | ICD-10-CM | POA: Diagnosis not present

## 2023-09-06 DIAGNOSIS — I10 Essential (primary) hypertension: Secondary | ICD-10-CM

## 2023-09-06 DIAGNOSIS — D649 Anemia, unspecified: Secondary | ICD-10-CM | POA: Diagnosis not present

## 2023-09-06 DIAGNOSIS — M109 Gout, unspecified: Secondary | ICD-10-CM

## 2023-09-06 LAB — POCT GLYCOSYLATED HEMOGLOBIN (HGB A1C): HbA1c, POC (prediabetic range): 5.8 % (ref 5.7–6.4)

## 2023-09-06 NOTE — Progress Notes (Signed)
BP 130/80   Pulse 95   Ht 5\' 10"  (1.778 m)   Wt 194 lb (88 kg)   SpO2 100%   BMI 27.84 kg/m    Subjective:    Patient ID: Adam Vasquez, male    DOB: 01-25-1941, 82 y.o.   MRN: [id number]  HPI: Adam Vasquez is a 82 y.o. male presenting on 09/06/2023 for comprehensive medical examination. Current medical complaints include:none  Hypertension: - Medications: hydrochlorothiazide, lisinopril - Compliance: good - Checking BP at home: no - Denies any SOB, CP, vision changes, LE edema, medication SEs, or symptoms of hypotension  Interim Problems from his last visit: no  Depression Screen done today and results listed below:     09/06/2023    9:00 AM 07/16/2023    3:18 PM 05/10/2023   11:07 AM 02/01/2023    4:24 PM 09/03/2022    8:37 AM  Depression screen PHQ 2/9  Decreased Interest 0 0 0 0 0  Down, Depressed, Hopeless 0 0 0 0 0  PHQ - 2 Score 0 0 0 0 0  Altered sleeping 0 0 0  0  Tired, decreased energy 0 0 0  0  Change in appetite 0 0 0  0  Feeling bad or failure about yourself  0 0 0  0  Trouble concentrating 0 0 0  0  Moving slowly or fidgety/restless 0 0 0  0  Suicidal thoughts 0 0 0  0  PHQ-9 Score 0 0 0  0  Difficult doing work/chores     Not difficult at all    Past Medical History:  Past Medical History:  Diagnosis Date   Allergy    Hypertension    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      Objective:    BP  130/80   Pulse 95   Ht 5\' 10"  (1.778 m)   Wt 194 lb (88 kg)   SpO2 100%   BMI 27.84 kg/m   Wt Readings from Last 3 Encounters:  09/06/23 194 lb (88 kg)  08/05/23 198 lb 9.6 oz (90.1 kg)  07/16/23 194 lb 6.4 oz (88.2 kg)    Physical Exam Vitals reviewed.  Constitutional:      Appearance: Normal appearance. He is normal weight.  HENT:     Head: Normocephalic and atraumatic.     Right Ear: Ear canal and external ear normal. There is impacted cerumen.     Left Ear: Tympanic membrane, ear canal and external ear normal.     Nose: Nose normal.     Mouth/Throat:     Mouth: Mucous membranes are moist.     Pharynx: Oropharynx is clear. No oropharyngeal exudate or posterior oropharyngeal erythema.  Eyes:     Extraocular Movements: Extraocular movements intact.     Pupils: Pupils are equal, round, and reactive to light.  Cardiovascular:     Rate and Rhythm: Normal rate and regular  rhythm.     Heart sounds: Normal heart sounds. No murmur heard. Pulmonary:     Effort: Pulmonary effort is normal.     Breath sounds: Normal breath sounds.  Abdominal:     General: Bowel sounds are normal.     Palpations: Abdomen is soft.     Tenderness: There is no abdominal tenderness.  Musculoskeletal:        General: Normal range of motion.     Cervical back: Normal range of motion.     Right lower leg: No edema.     Left lower leg: No edema.  Skin:    General: Skin is warm and dry.  Neurological:     Mental Status: He is alert and oriented to person, place, and time. Mental status is at baseline.     Gait: Gait normal.  Psychiatric:        Mood and Affect: Mood normal.        Behavior: Behavior normal.         Assessment & Plan:   Problem List Items Addressed This Visit       Cardiovascular and Mediastinum   HYPERTENSION, BENIGN SYSTEMIC (Chronic)    Doing well on current regimen, no changes made today. No further gout flares. No LE edema on exam.        Other   Anemia   Relevant  Orders   CBC   Gout    No further gout flares since restarting hydrochlorothiazide. Uric acid levels wnl. Continue to monitor.       Prediabetes   Relevant Orders   HgB A1c   Other Visit Diagnoses     Annual physical exam    -  Primary   Relevant Orders   PSA   CBC   HgB A1c        LABORATORY TESTING:  Health maintenance labs ordered today as discussed above.   The natural history of prostate cancer and ongoing controversy regarding screening and potential treatment outcomes of prostate cancer has been discussed with the patient. The meaning of a false positive PSA and a false negative PSA has been discussed. He indicates understanding of the limitations of this screening test and wishes to proceed with screening PSA testing.   IMMUNIZATIONS:   - Tdap: Tetanus vaccination status reviewed: last tetanus booster within 10 years. - Influenza: Up to date - Pneumococcal: Up to date - HPV: Not applicable - Shingrix vaccine: Up to date - COVID vaccine: UTD  SCREENING: - Colonoscopy: Not applicable  Discussed with patient purpose of the colonoscopy is to detect colon cancer at curable precancerous or early stages   - AAA Screening:  previously done, normal   - Lung cancer screening: n/a  Hep C Screening: UTD STD testing and prevention (HIV/chl/gon/syphilis): low risk  PATIENT COUNSELING:    Advanced Care Planning: A voluntary discussion about advance care planning including the explanation and discussion of advance directives.  Discussed health care proxy and Living will, and the patient was able to identify a health care proxy as daughter, Jordin Betancourt.  Patient does have a living will at present time. If patient does have living will, I have requested they bring this to the clinic to be scanned in to their chart.  Sexuality: Discussed sexually transmitted diseases, partner selection, use of condoms, avoidance of unintended pregnancy  and contraceptive alternatives.    Advised to avoid cigarette smoking.  I discussed with the patient that most people either abstain from alcohol or  drink within safe limits (<=14/week and <=4 drinks/occasion for males, <=7/weeks and <= 3 drinks/occasion for females) and that the risk for alcohol disorders and other health effects rises proportionally with the number of drinks per week and how often a drinker exceeds daily limits.  Discussed cessation/primary prevention of drug use and availability of treatment for abuse.   Diet: Encouraged to adjust caloric intake to maintain  or achieve ideal body weight, to reduce intake of dietary saturated fat and total fat, to limit sodium intake by avoiding high sodium foods and not adding table salt, and to maintain adequate dietary potassium and calcium preferably from fresh fruits, vegetables, and low-fat dairy products.    stressed the importance of regular exercise  Injury prevention: Discussed safety belts, safety helmets, smoke detector, smoking near bedding or upholstery.   Dental health: Discussed importance of regular tooth brushing, flossing, and dental visits.   Follow up plan: NEXT PREVENTATIVE PHYSICAL DUE IN 1 YEAR. Return in about 1 year (around 09/05/2024) for cpe.

## 2023-09-06 NOTE — Assessment & Plan Note (Signed)
Doing well on current regimen, no changes made today. No further gout flares. No LE edema on exam.

## 2023-09-06 NOTE — Patient Instructions (Signed)
Preventive Care 65 Years and Older, Male Preventive care refers to lifestyle choices and visits with your health care provider that can promote health and wellness. Preventive care visits are also called wellness exams. What can I expect for my preventive care visit? Counseling During your preventive care visit, your health care provider may ask about your: Medical history, including: Past medical problems. Family medical history. History of falls. Current health, including: Emotional well-being. Home life and relationship well-being. Sexual activity. Memory and ability to understand (cognition). Lifestyle, including: Alcohol, nicotine or tobacco, and drug use. Access to firearms. Diet, exercise, and sleep habits. Work and work environment. Sunscreen use. Safety issues such as seatbelt and bike helmet use. Physical exam Your health care provider will check your: Height and weight. These may be used to calculate your BMI (body mass index). BMI is a measurement that tells if you are at a healthy weight. Waist circumference. This measures the distance around your waistline. This measurement also tells if you are at a healthy weight and may help predict your risk of certain diseases, such as type 2 diabetes and high blood pressure. Heart rate and blood pressure. Body temperature. Skin for abnormal spots. What immunizations do I need?  Vaccines are usually given at various ages, according to a schedule. Your health care provider will recommend vaccines for you based on your age, medical history, and lifestyle or other factors, such as travel or where you work. What tests do I need? Screening Your health care provider may recommend screening tests for certain conditions. This may include: Lipid and cholesterol levels. Diabetes screening. This is done by checking your blood sugar (glucose) after you have not eaten for a while (fasting). Hepatitis C test. Hepatitis B test. HIV (human  immunodeficiency virus) test. STI (sexually transmitted infection) testing, if you are at risk. Lung cancer screening. Colorectal cancer screening. Prostate cancer screening. Abdominal aortic aneurysm (AAA) screening. You may need this if you are a current or former smoker. Talk with your health care provider about your test results, treatment options, and if necessary, the need for more tests. Follow these instructions at home: Eating and drinking  Eat a diet that includes fresh fruits and vegetables, whole grains, lean protein, and low-fat dairy products. Limit your intake of foods with high amounts of sugar, saturated fats, and salt. Take vitamin and mineral supplements as recommended by your health care provider. Do not drink alcohol if your health care provider tells you not to drink. If you drink alcohol: Limit how much you have to 0-2 drinks a day. Know how much alcohol is in your drink. In the [place] one drink equals one 12 oz bottle of beer (355 mL), one 5 oz glass of wine (148 mL), or one 1 oz glass of hard liquor (44 mL). Lifestyle Brush your teeth every morning and night with fluoride toothpaste. Floss one time each day. Exercise for at least 30 minutes 5 or more days each week. Do not use any products that contain nicotine or tobacco. These products include cigarettes, chewing tobacco, and vaping devices, such as e-cigarettes. If you need help quitting, ask your health care provider. Do not use drugs. If you are sexually active, practice safe sex. Use a condom or other form of protection to prevent STIs. Take aspirin only as told by your health care provider. Make sure that you understand how much to take and what form to take. Work with your health care provider to find out whether it is safe   and beneficial for you to take aspirin daily. Ask your health care provider if you need to take a cholesterol-lowering medicine (statin). Find healthy ways to manage stress, such  as: Meditation, yoga, or listening to music. Journaling. Talking to a trusted person. Spending time with friends and family. Safety Always wear your seat belt while driving or riding in a vehicle. Do not drive: If you have been drinking alcohol. Do not ride with someone who has been drinking. When you are tired or distracted. While texting. If you have been using any mind-altering substances or drugs. Wear a helmet and other protective equipment during sports activities. If you have firearms in your house, make sure you follow all gun safety procedures. Minimize exposure to UV radiation to reduce your risk of skin cancer. What's next? Visit your health care provider once a year for an annual wellness visit. Ask your health care provider how often you should have your eyes and teeth checked. Stay up to date on all vaccines. This information is not intended to replace advice given to you by your health care provider. Make sure you discuss any questions you have with your health care provider. Document Revised: 03/12/2021 Document Reviewed: 03/12/2021 Elsevier Patient Education  2024 [employer]vier Inc.  

## 2023-09-06 NOTE — Assessment & Plan Note (Signed)
No further gout flares since restarting hydrochlorothiazide. Uric acid levels wnl. Continue to monitor.

## 2023-09-07 ENCOUNTER — Encounter: Payer: Self-pay | Admitting: Family Medicine

## 2023-09-07 LAB — CBC
Hematocrit: 37.7 % (ref 37.5–51.0)
Hemoglobin: 12.4 g/dL — ABNORMAL LOW (ref 13.0–17.7)
MCH: 30 pg (ref 26.6–33.0)
MCHC: 32.9 g/dL (ref 31.5–35.7)
MCV: 91 fL (ref 79–97)
Platelets: 220 10*3/uL (ref 150–450)
RBC: 4.13 x10E6/uL — ABNORMAL LOW (ref 4.14–5.80)
RDW: 11.4 % — ABNORMAL LOW (ref 11.6–15.4)
WBC: 4.2 10*3/uL (ref 3.4–10.8)

## 2023-09-07 LAB — PSA: Prostate Specific Ag, Serum: 0.6 ng/mL (ref 0.0–4.0)

## 2023-11-09 ENCOUNTER — Encounter: Payer: Self-pay | Admitting: Family Medicine

## 2023-11-09 ENCOUNTER — Ambulatory Visit (INDEPENDENT_AMBULATORY_CARE_PROVIDER_SITE_OTHER): Payer: No Typology Code available for payment source | Admitting: Family Medicine

## 2023-11-09 VITALS — BP 120/75 | HR 52 | Wt 196.8 lb

## 2023-11-09 DIAGNOSIS — H903 Sensorineural hearing loss, bilateral: Secondary | ICD-10-CM

## 2023-11-09 DIAGNOSIS — R413 Other amnesia: Secondary | ICD-10-CM | POA: Diagnosis not present

## 2023-11-09 DIAGNOSIS — R4181 Age-related cognitive decline: Secondary | ICD-10-CM | POA: Insufficient documentation

## 2023-11-09 DIAGNOSIS — H40053 Ocular hypertension, bilateral: Secondary | ICD-10-CM | POA: Insufficient documentation

## 2023-11-09 HISTORY — DX: Sensorineural hearing loss, bilateral: H90.3

## 2023-11-09 HISTORY — DX: Ocular hypertension, bilateral: H40.053

## 2023-11-09 NOTE — Patient Instructions (Signed)
It was great to see you!  Our plans for today:  - We are checking some labs today, we will release these results to your MyChart. - We took a copy of your advanced directives. - If your labs are normal, we will plan to follow up with our geriatrician Dr. McDiarmid for more in depth testing as well as an MRI of your brain to rule out structural disease.  Take care and seek immediate care sooner if you develop any concerns.   Dr. Linwood Dibbles
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
## 2023-11-09 NOTE — Assessment & Plan Note (Addendum)
Noted by significant other and her niece. Unclear precipitating factor but wonder if recent life changes are contributing. Otherwise no contributors elicited on history, exam. Neuro exam normal and intact 3 word recall and clock drawing. Will obtain labs to assess for contributors. If unrevealing, recommend more extensive memory evaluation with Geri clinic and Brain MRI. PHQ next visit.

## 2023-11-10 ENCOUNTER — Encounter: Payer: Self-pay | Admitting: Family Medicine

## 2023-11-10 LAB — CBC
Hematocrit: 40.1 % (ref 37.5–51.0)
Hemoglobin: 13.1 g/dL (ref 13.0–17.7)
MCH: 30 pg (ref 26.6–33.0)
MCHC: 32.7 g/dL (ref 31.5–35.7)
MCV: 92 fL (ref 79–97)
Platelets: 220 10*3/uL (ref 150–450)
RBC: 4.36 x10E6/uL (ref 4.14–5.80)
RDW: 11.6 % (ref 11.6–15.4)
WBC: 3.7 10*3/uL (ref 3.4–10.8)

## 2023-11-10 LAB — COMPREHENSIVE METABOLIC PANEL
ALT: 20 [IU]/L (ref 0–44)
AST: 23 [IU]/L (ref 0–40)
Albumin: 4.5 g/dL (ref 3.7–4.7)
Alkaline Phosphatase: 64 [IU]/L (ref 44–121)
BUN/Creatinine Ratio: 20 (ref 10–24)
BUN: 24 mg/dL (ref 8–27)
Bilirubin Total: 0.8 mg/dL (ref 0.0–1.2)
CO2: 24 mmol/L (ref 20–29)
Calcium: 9.8 mg/dL (ref 8.6–10.2)
Chloride: 102 mmol/L (ref 96–106)
Creatinine, Ser: 1.18 mg/dL (ref 0.76–1.27)
Globulin, Total: 3.2 g/dL (ref 1.5–4.5)
Glucose: 100 mg/dL — ABNORMAL HIGH (ref 70–99)
Potassium: 4.7 mmol/L (ref 3.5–5.2)
Sodium: 140 mmol/L (ref 134–144)
Total Protein: 7.7 g/dL (ref 6.0–8.5)
eGFR: 62 mL/min/{1.73_m2} (ref >59)

## 2023-11-10 LAB — TSH: TSH: 2.34 u[IU]/mL (ref 0.450–4.500)

## 2023-11-10 LAB — VITAMIN B12: Vitamin B-12: 633 pg/mL (ref 232–1245)

## 2023-11-10 LAB — RPR: RPR Ser Ql: NONREACTIVE

## 2023-11-10 NOTE — Addendum Note (Signed)
Addended by: [name] on: 11/10/2023 09:44 AM   Modules accepted: Orders

## 2023-11-11 NOTE — Progress Notes (Signed)
Attempted to reach patient to inform that I am sending out the geri package to home address, and for patient to bring the package completed to app. Aquilla Solian, CMA

## 2023-11-11 NOTE — Progress Notes (Signed)
Spoke with patient. Made appt for 4/10 at 2:30 for Geri. Aquilla Solian, CMA

## 2023-11-25 ENCOUNTER — Encounter: Payer: Self-pay | Admitting: Family Medicine

## 2023-11-27 ENCOUNTER — Other Ambulatory Visit: Payer: No Typology Code available for payment source

## 2023-11-29 ENCOUNTER — Ambulatory Visit
Admission: RE | Admit: 2023-11-29 | Discharge: 2023-11-29 | Disposition: A | Discharge status: Home / Self Care | Payer: No Typology Code available for payment source | Source: Ambulatory Visit | Attending: Family Medicine | Admitting: Family Medicine

## 2023-11-29 DIAGNOSIS — R413 Other amnesia: Secondary | ICD-10-CM

## 2023-11-30 ENCOUNTER — Other Ambulatory Visit: Payer: No Typology Code available for payment source

## 2023-12-14 ENCOUNTER — Telehealth: Payer: Self-pay

## 2023-12-14 NOTE — Telephone Encounter (Signed)
 Patient calls nurse line requesting results from MRI.   He reports this was done on 3/3, however he has not heard anything.   Patient advised the imaging is not back yet. Discussed the unfortunate turn around times we are seeing.  Advised we will let him know the results as soon as we receive them.   Patient was appreciative of call.

## 2023-12-20 ENCOUNTER — Encounter: Payer: Self-pay | Admitting: Family Medicine

## 2024-01-06 ENCOUNTER — Ambulatory Visit: Payer: No Typology Code available for payment source | Admitting: Family Medicine

## 2024-01-06 VITALS — BP 110/60 | HR 60 | Ht 67.72 in | Wt 196.4 lb

## 2024-01-06 DIAGNOSIS — E291 Testicular hypofunction: Secondary | ICD-10-CM | POA: Insufficient documentation

## 2024-01-06 DIAGNOSIS — R4181 Age-related cognitive decline: Secondary | ICD-10-CM | POA: Diagnosis not present

## 2024-01-06 DIAGNOSIS — R7989 Other specified abnormal findings of blood chemistry: Secondary | ICD-10-CM | POA: Diagnosis not present

## 2024-01-06 DIAGNOSIS — Z87891 Personal history of nicotine dependence: Secondary | ICD-10-CM | POA: Insufficient documentation

## 2024-01-06 DIAGNOSIS — L608 Other nail disorders: Secondary | ICD-10-CM | POA: Insufficient documentation

## 2024-01-06 DIAGNOSIS — M1A0790 Idiopathic chronic gout, unspecified ankle and foot, without tophus (tophi): Secondary | ICD-10-CM

## 2024-01-06 DIAGNOSIS — M109 Gout, unspecified: Secondary | ICD-10-CM | POA: Diagnosis not present

## 2024-01-06 DIAGNOSIS — E559 Vitamin D deficiency, unspecified: Secondary | ICD-10-CM | POA: Insufficient documentation

## 2024-01-06 HISTORY — DX: Testicular hypofunction: E29.1

## 2024-01-06 HISTORY — DX: Personal history of nicotine dependence: Z87.891

## 2024-01-06 NOTE — Progress Notes (Signed)
 I saw Dr Su Hilt with Dr Laroy Apple. 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  His 6-CIT test during his annual wellness visit on on 02/01/23 was a reassuring zero points.  - He remains independent in advanced activities of daily living, and instrumental & basic activities of daily living. His Brain MRI had no acute shcnages and was o/w unremarkable.  - We discussed the role of exercise, mediterranean diet, and social activity in slowing decline in memory.  - Mr Robert's Durable financial power of attorney was scanned into the chart. His HC POA document is already in the ACP section of EMR.  - Recommend annual review of Dr Su Hilt' functioning with review of activities of daily living.  Should a noticeable change in ADLs occur, would repeat MoCA testing.

## 2024-01-06 NOTE — Assessment & Plan Note (Signed)
 MoCA slight decline from 28 years ago to 24 today. Very independent in abilities. Highly educated. Discussed dx of age related cognitive decline. Also discussed if this were to worsen or any further concerns could consider referral for neuropsychological evaluation.

## 2024-01-06 NOTE — Progress Notes (Deleted)
 Provider:  Acquanetta Belling, MD Location:   [facility]   Place of Service:   SAME  PCP: [name], DO Patient Care Team: [name], DO as PCP - General (Family Medicine) [name], MD (Ophthalmology) Clinic, [facility]  Extended Emergency Contact Information Primary Emergency Contact: Casidy,Tammarah Address: [address]          [place], [place] [address] [name] of [place] Phone: [phone] Relation: Daughter  Code Status: DNR Goals of Care: Advanced Directive information    11/09/2023   11:36 AM  Advanced Directives  Does Patient Have a Medical Advance Directive? No     [facility]:   Patient is {alone or w companion:315710} Primary caregiver:  {relatives - adult:5061::"spouse"} Patient's Currently living arrangement:  {relatives - adult:5061::"spouse"} Patient information was obtained from {Outside review:19423}  {info source:60032}. History/Exam limitations:  {limitations:60112::"none"} Primary Care Provider:   *** Referring provider:  *** Reason for referral:  ***  ----------------------------------------------------------------------------------------------------------------------------------------------------------------------------------------------------------------------------------------------------------------   HPI by problems:  No chief complaint on file.   Cognitive impairment concern  Are there problems with thinking?  {Cognition domains:25186}  When were the changes first noticed?  {Days to years:10300}  Did this change occur abruptly or gradually?  {abrupt, gradual:20671}  How have the changes progressed since then?  {progression since onset:119226}  Has there been any tremors or abnormal movements?  {YES/NO/WILD OZHYQ:65784}  Have they had in hallucinations or delusions:  {YES/NO/WILD CARDS:18581}  Have they appeared more anxious or sad lately?   {YES/NO/WILD ONGEX:52841}  Do they still have interests or activities they enjor doing?  {YES/NO/WILD LKGMW:10272}  How has their appetite been lately?  {Improving/worsening/no change:60406}  How has their sleep been lately?  {Sleep Symptoms:25185}    Compared to 5 to 10 years ago, how is the patient at:  Problems with Judgment, e.g., problem making decisions, bad financial decisions, problems with thinking?  {YES/NO/WILD CARDS:18581}  Less interested in hobbies or previously enjoyed activities?  {YES/NO/WILD ZDGUY:40347}  Problem remembering things about family and friends e.g. names,  occupations, birthdays, addresses?  {YES/NO/WILD QQVZD:63875}  Problem remembering conversations or news events a few days later?  {YES/NO/WILD IEPPI:95188}  Problem remembering what day and month it is? {YES/NO/WILD CZYSA:63016}  Problem with losing things?  {YES/NO/WILD WFUXN:23557}  Problem learning to use a new gadget or machine around the house, e.g., cell phones, computer, microwave, remote control?  {YES/NO/WILD DUKGU:54270}  Problem with handling money for shopping?  {YES/NO/WILD WCBJS:28315}  Problem handling financial matters, e.g. their pension, checking, credit cards, dealing with the bank?  {YES/NO/WILD VVOHY:07371}  Problem with getting lost in familiar places?  {YES/NO/WILD GGYIR:48546}   Geriatric Depression Scale:  *** / 15  PHQ-9: Flowsheet Row Office Visit from 09/06/2023 in [facility] [place]  PHQ-9 Total Score 0        Outpatient Encounter Medications as of 01/06/2024  Medication Sig   Ascorbic Acid (VITAMIN C) 500 MG tablet Take 500 mg by mouth daily.     Cholecalciferol (VITAMIN D3 PO) Take by mouth.   hydrochlorothiazide (HYDRODIURIL) 25 MG tablet Take 1 tablet (25 mg total) by mouth daily.   latanoprost (XALATAN) 0.005 % ophthalmic solution Apply 1 drop to eye daily.   lisinopril (ZESTRIL) 20 MG  tablet Take 2 tablets (40 mg total) by mouth daily.   loratadine (CLARITIN) 10 MG tablet Take 10 mg by mouth daily as needed.  Omega-3 Fatty Acids (RA FISH OIL) 1000 MG CAPS Take 3 capsules by mouth once daily   No facility-administered encounter medications on file as of 01/06/2024.    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 Left ventricular ejection fraction, by estimation, is 60 to 65%. The  left ventricle has normal function. The left ventricle has no regional  wall motion abnormalities. There is moderate left ventricular hypertrophy  of the basal-septal segment. There  is mild left ventricular hypertrophy of the rest of the LV segments. 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 Family History  Problem Relation Age of Onset    Diabetes Mother    Heart disease Mother    Hypertension Mother    Diabetes Father    Other Neg Hx    Social History   Socioeconomic History   Marital status: Widowed    Spouse name: [place]   Number of children: 2   Years of education: 18   Highest education level: Doctorate  Occupational History   Occupation: Pyschologist  Tobacco Use   Smoking status: Former    Current packs/day: 0.00    Types: Cigarettes    Quit date: 05/18/1976    Years since quitting: 47.6   Smokeless tobacco: Never  Substance and Sexual Activity   Alcohol use: Yes    Alcohol/week: 1.0 standard drink of alcohol    Types: 1 Standard drinks or equivalent per week   Drug use: No   Sexual activity: Yes  Other Topics Concern   Not on file  Social History Narrative   Health Care POA: Lorenda Ishihara, wife    Emergency Contact: [place] or daughter Myquan [place] (c) [phone], (h) [phone]   End of Life Plan: pt reports having living will. Pt will bring a copy to their next visit.   Who lives with you: wife   Any pets: none   Diet: Pt has a varied diet and focuses on fruits and vegetables.  Pt reports not snacking any longer.   Exercise: Pt swims, runs, and does weight resistance 3-4 times a week.   Seatbelts: Pt reports wearing seatbelt when in vehicle.   Wynelle Link Exposure/Protection: Pt reports wearing hats when he is running outside and wearing sunscreen at the beach.  Hobbies: running, music, travel, gardening               Social Drivers of Health   Financial Resource Strain: Low Risk  (11/07/2023)   Overall Financial Resource Strain (CARDIA)    Difficulty of Paying Living Expenses: Not hard at all  Food Insecurity: No Food Insecurity (11/07/2023)   Hunger Vital Sign    Worried About Running Out of Food in the Last Year: Never true    Ran Out of Food in the Last Year: Never true  Transportation Needs: No Transportation Needs (11/07/2023)   PRAPARE - Administrator, Civil Service  (Medical): No    Lack of Transportation (Non-Medical): No  Physical Activity: Sufficiently Active (11/07/2023)   Exercise Vital Sign    Days of Exercise per Week: 6 days    Minutes of Exercise per Session: 60 min  Stress: No Stress Concern Present (11/07/2023)   [employer] of Occupational Health - Occupational Stress Questionnaire    Feeling of Stress : Not at all  Social Connections: Moderately Integrated (11/07/2023)   Social Connection and Isolation Panel [NHANES]    Frequency of Communication with Friends and Family: More than three times a week    Frequency of Social Gatherings with Friends and Family: More than three times a week    Attends Religious Services: More than 4 times per year    Active Member of [employer] or Organizations: Yes    Attends Banker Meetings: More than 4 times per year    Marital Status: Widowed      Cardiovascular Risk Factors: {CHL CARDIAC RISK FACTORS STRESS MWUX:32440102}  Educational History: *** years formal education Personal History of Seizures: {question; yes no :20885} Personal History of Stroke: {question; yes no :20885} Personal History of Head Trauma: {question; yes no :20885} Personal History of Psychiatric Disorders: {question; yes no :20885} Family History of Dementia: {question; yes no :20885}   Basic Activities of Daily Living  Dressing: {ADL:18316} Eating: {ADL:18316} Ambulation: {ADL:18316} Toileting: {ADL:18316} Bathing: {ADL:18316}  Instrumental Activities of Daily Living Shopping: {VOZ:36644} House/Yard Work: {IHK:74259} Administration of medications: {ADL:18316} Finances: {DGL:87564} Telephone: {PPI:95188} Transportation: {CZY:60630}  Mobility Assist Devices: {Assist Devices:25183}  Caregivers in home: {relatives - adult:5061::"spouse"}  Caregiver Stress Self-Assessment (Zarit Score):  *** out of 80 Scoring: 0-20 = Little/No stress       21-40 = Mild/Moderate stress       41-60 = Moderate/Severe stress       61-80 = Severe stress   Formal Home Health Assistance  Physical Therapy: {YES/NO/WILD ZSWFU:93235}  Occupational Therapy: {YES/NO/WILD TDDUK:02542}             Home Aid / Personal Care Service: {YES/NO/WILD HCWCB:76283}             Homemaker services: {YES/NO/WILD TDVVO:16073}  FALLS in last five office visits:     09/06/2023    8:38 AM 05/10/2023   11:07 AM 02/01/2023    4:23 PM 05/05/2021    8:39 AM 07/27/2019    9:36 AM  Fall Risk   Falls in the past year? 0 0 0 0 0  Number falls in past yr: 0 0 0    Injury with Fall? 0 0 0    Risk for fall due to :   No Fall Risks    Follow up   Falls prevention discussed;Education provided;Falls evaluation completed      Health Maintenance reviewed: Immunization History  Administered Date(s) Administered   Fluad Quad(high Dose  65+) 06/17/2022   Influenza Split 07/29/2013   Influenza Whole 07/10/2008, 08/07/2009, 07/05/2010   Influenza, High Dose Seasonal PF 06/18/2011, 06/28/2014, 06/28/2016, 06/28/2017, 06/28/2018, 06/13/2019, 06/09/2023   Influenza,inj,Quad PF,6+ Mos 06/28/2014   Influenza-Unspecified 06/29/2007, 06/28/2009, 06/28/2012, 07/18/2015, 06/29/2017, 06/28/2018, 07/16/2019, 06/24/2020, 07/07/2021, 06/17/2022, 06/09/2023   Moderna Covid-19 Vaccine Bivalent Booster 40yrs & up 07/03/2021   PFIZER Comirnaty(Gray Top)Covid-19 Tri-Sucrose Vaccine 11/21/2019, 12/12/2019, 06/24/2020, 12/27/2020   Pfizer Covid-19 Vaccine Bivalent Booster 64yrs & up 06/25/2021, 01/29/2022   Pfizer(Comirnaty)Fall Seasonal Vaccine 12 years and older 06/17/2022, 06/09/2023   Pneumococcal Conjugate-13 07/29/2013, 08/23/2013   Pneumococcal Polysaccharide-23 07/29/2001, 06/25/2009, 03/21/2018   Pneumococcal-Unspecified 06/29/2007   Respiratory Syncytial Virus Vaccine,Recomb Aduvanted(Arexvy) 10/01/2022   Td 07/29/2001   Tdap 06/28/2008, 08/12/2011, 09/01/2021   Zoster Recombinant(Shingrix) 07/19/2017, 12/06/2017, 07/19/2018, 09/28/2020   Zoster, Live  08/12/2005   Health Maintenance Topics with due status: Overdue     Topic Date Due   COVID-19 Vaccine 12/07/2023   Health Maintenance Topics with due status: Due Soon     Topic Date Due   Medicare Annual Wellness (AWV) 02/01/2024    Diet: {DIET STATUS:21022986} Nutritional supplements: {NUTRITION SUPPLEMENTS:220002}  Geriatric Syndromes: Constipation {YES NO:22349}  Laxative use:{YES NO:22349}   Incontinence {YES NO:22349}  Nocturia: {YES NO:22349} Dizziness {YES NO:22349}   Syncope {YES NO:22349}  Visual Impairment {YES NO:22349}   Hearing impairment {YES NO:22349} Dentures problems: {YES NO:22349} Impaired Memory or Cognition {YES NO:22349}   Sleep problems {YES NO:22349}   Weight loss {YES NO:22349} Drug Misadventure: {YES/NO/WILD CARDS:18581}   Joint pain: {YES NO:22349} Ankle edema: {YES NO:22349} History of UTIs: {YES/NO/WILD CARDS:18581}   Vital Signs   There is no height or weight on file to calculate BMI. CrCl cannot be calculated (Patient's most recent lab result is older than the maximum 21 days allowed.). There is no height or weight on file to calculate BSA. There were no vitals filed for this visit. Wt Readings from Last 3 Encounters:  11/09/23 196 lb 12.8 oz (89.3 kg)  09/06/23 194 lb (88 kg)  08/05/23 198 lb 9.6 oz (90.1 kg)   No results found.  Physical Examination:  VS reviewed Physical Exam There were no vitals filed for this visit.  HEENT: Bilateral EAC adequately patent, I.e., able to see TMs General physical exam: well-dressed and groomed. Pleasant and cooperative. Cardiac: Regular rate and rhythm without murmur. No carotid bruits Lungs: Clear to auscultation.   No other insights were derived from the general Exam   Parkinsonism Yes []  No []   Rigidity Ab nl ? Yes []  No []   Cranial Nerve Abnl ? Yes []  No []   Vestibulo-ocular reflex (HINTS)  Abnl ? Yes []  No []   Smooth Pursuit Abnl Yes []  No []   Sensory Abnl ? Yes []  No []   Gait  Abnl ? []  slow, []  petit ped, []  heel past toe no present, []  forward stoop present, []  en bloc turn present, []  symmetric stance phase, []  leg pasts midline present []  push back postural Stability Abnl Reflexes Abnl ? Yes []  No []  Very brisk in LE  Babinski Abnl Yes []  No []   Motor Abnl ? Yes []  No []   Tremor ? Yes []  No []     Timed Up & Go Test: *** seconds Sit-to-Stand Test: *** / 30-seconds 4-Stage Stand Test:  Feet Side-by-side: {yes no:314532}     Feet Semi-tandem: {yes no:314532}     Feet Tandem: {yes no:314532}     One-Foot Stand:  {yes no:314532}   Mini-Mental State  Examination or Montreal Cognitive Assessment:  Patient {Desc; did/not:3044021} require additional cues or prompts to complete tasks. Patient {WAS/WAS [phone]::"was not"} cooperative and attentive to testing tasks Patient {Desc; did/not:3044021} appear motivated to perform well         02/01/2023    4:26 PM  6CIT Screen  What Year? 0 points  What month? 0 points  What time? 0 points  Count back from 20 0 points  Months in reverse 0 points  Repeat phrase 0 points  Total Score 0 points       03/22/2014   11:00 AM 06/28/2012    5:00 PM 05/19/2011    2:00 PM  MMSE - Mini Mental State Exam  Orientation to time 4 5 5   Orientation to Place 5 5 5   Registration 3 3 3   Attention/ Calculation 5 5 5   Recall 3 3 3   Language- name 2 objects 2 2 2   Language- repeat 1 1 1   Language- follow 3 step command 3 3 3   Language- read & follow direction 1 1 1   Write a sentence 1 1 1   Copy design 0 1 1  Total score 28 30 30             No data to display           Labs No components found for: "VITAMIND"  Lab Results  Component Value Date   VITAMINB12 633 11/09/2023    No results found for: "FOLATE"  Lab Results  Component Value Date   TSH 2.340 11/09/2023    No results found for: "RPR"  No results found for: "HIV"    Chemistry      Component Value Date/Time   NA 140 11/09/2023 1208   K  4.7 11/09/2023 1208   CL 102 11/09/2023 1208   CO2 24 11/09/2023 1208   BUN 24 11/09/2023 1208   CREATININE 1.18 11/09/2023 1208   CREATININE 1.18 09/15/2016 0833      Component Value Date/Time   CALCIUM 9.8 11/09/2023 1208   ALKPHOS 64 11/09/2023 1208   AST 23 11/09/2023 1208   ALT 20 11/09/2023 1208   BILITOT 0.8 11/09/2023 1208       CrCl cannot be calculated (Patient's most recent lab result is older than the maximum 21 days allowed.).   Lab Results  Component Value Date   HGBA1C 5.8 09/06/2023     @10RELATIVEDAYS @No  results found. Lab Results  Component Value Date   WBC 3.7 11/09/2023   HGB 13.1 11/09/2023   HCT 40.1 11/09/2023   MCV 92 11/09/2023   PLT 220 11/09/2023    No results found for this or any previous visit (from the past 24 hours).  Imaging Head CT: ***  Brain MRI: 11/29/23: Unremarkable, NAD   Personal Strengths {PATIENT STRENGTHS:22666}  Support System Strengths {Patient Coping Strengths:7085334633}   Advanced Directives Code Status: *** Advance Directives: *** {Palliative Code status:23503}   ------------------------------------------------------------------------------------------------------------------------------------------------------------------------------------------------------------------------------------------------------------------------------------------------------------------------------------------------------------------------------------------------------------------------------------------------------------------------------------------------------------  Assessment and Plan: Please see individual consultation notes from physical therapy, pharmacy and social work for today.    Problem List Items Addressed This Visit   None  No problem-specific Assessment & Plan notes found for this encounter.      Primary Contact: Extended Emergency Contact Information Primary Emergency Contact: Casidy,Tammarah Address: [address], [place] [address] [name] of [place] Phone: [phone] Relation: Daughter  Patient to Follow up with  Dr.*** or [facility] in {  NUMBER 1-10:22536} {Time; day/wk/mo/yr(s):9076}  > *** minutes face to face were spent in total with precharting, history & physical gathering, nterdisciplinary discussion, patient and caretaker counseling and coordination of care and documentation. The Geriatric medical team meet to discuss the patient's Greater than 50% of this time was spent in counseling, explanation of diagnosis, planning of further management, and coordination of care.  The interdisciplinary team consisted of representatives from medicine, pharmacy.  Referral to [facility] for SDOH review and education about resources {WAS/WAS NOT:[id number]::"was not"} made.

## 2024-01-06 NOTE — Patient Instructions (Addendum)
 It was great to see you! Thank you for allowing me to participate in your care!   Our plans for today:  - Your MOCA testing today showed a slight decline from your previous testing (score of 24) this is likely due to age related cognitive changes.  - Please exercise, eat mediterranean diet, stay social - I will mail a letter of your exam!  Take care and seek immediate care sooner if you develop any concerns.  Levin Erp, MD

## 2024-01-06 NOTE — Progress Notes (Signed)
 Pharmacy consulted to complete medication reconciliation for geriatric clinic.  Patient arrives in good spirits. Medication reconciliation completed by patient. Medication Issues Identified: Medication list - up to date    Plan: Consider lisinopril to losartan exchange IF uric acid elevated.  Patient reports history of SINGLE gout episode in the past.

## 2024-01-06 NOTE — Progress Notes (Signed)
 Provider:  Acquanetta Belling, MD Location:   [facility]   Place of Service:   SAME  PCP: [name], DO Patient Care Team: [name], DO as PCP - General (Family Medicine) [name], MD (Ophthalmology) Clinic, [facility]  Extended Emergency Contact Information Primary Emergency Contact: Casidy,Tammarah Address: [address]          [place], [place] [address] [name] of [place] Phone: [phone] Relation: Daughter  Code Status: DNR Goals of Care: Advanced Directive information    11/09/2023   11:36 AM  Advanced Directives  Does Patient Have a Medical Advance Directive? No     [facility]:   Patient is alone Primary caregiver:  patient and spouse Patient's Currently living arrangement:  patient  Patient information was obtained from office notes  patient. History/Exam limitations:  none Primary Care Provider:   Rumball Referring provider:  Rumball Reason for referral:  memory concern from girlfriend  ----------------------------------------------------------------------------------------------------------------------------------------------------------------------------------------------------------------------------------------------------------------   HPI by problems:  Discussed the use of AI scribe software for clinical note transcription with the patient, who gave verbal consent to proceed.  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 The patient denies any subjective memory issues and attributes the concerns to a single incident of disagreement over a conversation. He reports no difficulties with daily activities, learning new technologies, handling finances, or getting lost in familiar places. He does not feel uncomfortable being left  alone. 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 His wife, a Designer, jewellery, passed away a year and a half ago. He denies any difficulties with his transition to retirement and unemployment.   Cognitive impairment concern  Are there problems with thinking?  Cognition domains: No difficulties with thinking nor memory  When were the changes first noticed?  months  Did this change occur abruptly or gradually?  Gradual (1 time per patient with romantic partner conversation)  How have the changes progressed since then?  staying constant  Has there been any tremors or abnormal movements?  no   Have they appeared more anxious or sad lately?  no  Do they still have interests or activities they enjor doing?  yes  How has their appetite been lately?  are improving    Compared to 5 to 10 years ago, how is the patient at:  Problems with Judgment, e.g., problem making decisions, bad financial decisions, problems with thinking?  no  Less interested in hobbies or previously enjoyed activities?  no  Problem remembering things about family and friends e.g. names,  occupations, birthdays, addresses?  no  Problem remembering conversations or news events a few days later?  no  Problem remembering what day and month it is? no  Problem with losing things?  no  Problem learning to use a new gadget or machine around the house, e.g., cell phones, computer, microwave, remote control?  no  Problem with handling money for shopping?  no  Problem handling financial matters, e.g. their pension, checking, credit cards, dealing with the bank?  no  Problem with getting lost in familiar places?  no   Geriatric Depression Scale:  0 / 15  PHQ-9: Flowsheet Row Office Visit from 09/06/2023 in [facility] . [facility]  PHQ-9 Total Score 0         Outpatient Encounter Medications as of 01/06/2024  Medication Sig   Ascorbic Acid (VITAMIN C) 500 MG tablet Take 500 mg by mouth daily.     Cholecalciferol (VITAMIN D3 PO) Take by mouth.   hydrochlorothiazide (HYDRODIURIL) 25 MG tablet Take 1 tablet (25 mg total) by mouth daily.   latanoprost (XALATAN) 0.005 % ophthalmic solution Apply 1 drop to eye daily.   lisinopril (ZESTRIL) 20 MG tablet Take 2 tablets (40 mg total) by mouth daily.   loratadine (CLARITIN) 10 MG tablet Take 10 mg by mouth daily as needed.    Omega-3 Fatty Acids (RA FISH OIL) 1000 MG CAPS Take 3 capsules by mouth once daily   No facility-administered encounter medications on file as of 01/06/2024.    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 Left ventricular ejection fraction, by estimation, is 60 to 65%. The  left ventricle has normal function. The left ventricle has no regional  wall motion abnormalities. There is moderate left ventricular hypertrophy  of the basal-septal segment. There  is mild left ventricular hypertrophy of the rest of the LV segments. 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 Family History  Problem Relation Age of Onset   Diabetes Mother    Heart disease Mother    Hypertension Mother    Diabetes Father    Other Neg Hx    Social History   Socioeconomic History   Marital status: Widowed    Spouse name: [place]   Number of children: 2   Years of education: 18   Highest education level: Doctorate  Occupational History   Occupation: Pyschologist  Tobacco Use   Smoking status: Former    Current packs/day: 0.00    Types: Cigarettes    Quit date: 05/18/1976    Years since quitting: 47.6   Smokeless tobacco: Never  Substance and Sexual Activity   Alcohol use: Yes    Alcohol/week: 1.0 standard drink of alcohol    Types: 1 Standard drinks or equivalent per week   Drug use: No   Sexual activity: Yes  Other Topics Concern   Not on file  Social History Narrative   Health Care POA: Kerby Borner, wife  Emergency Contact: [place] or daughter Norfleet Capers (c) [phone], (h) [phone]   End of Life Plan: pt reports having living will. Pt will bring a copy to their next visit.   Who lives with you: wife   Any pets: none   Diet: Pt has a varied diet and focuses on fruits and vegetables.  Pt reports not snacking any longer.   Exercise: Pt swims, runs, and does weight resistance 3-4 times a week.   Seatbelts: Pt reports wearing seatbelt when in vehicle.   Wynelle Link Exposure/Protection: Pt reports wearing hats when he is running outside and wearing sunscreen at the beach.    Hobbies: running, music, travel, gardening               Social Drivers of Health   Financial Resource Strain: Low Risk  (11/07/2023)   Overall  Financial Resource Strain (CARDIA)    Difficulty of Paying Living Expenses: Not hard at all  Food Insecurity: No Food Insecurity (11/07/2023)   Hunger Vital Sign    Worried About Running Out of Food in the Last Year: Never true    Ran Out of Food in the Last Year: Never true  Transportation Needs: No Transportation Needs (11/07/2023)   PRAPARE - Administrator, Civil Service (Medical): No    Lack of Transportation (Non-Medical): No  Physical Activity: Sufficiently Active (11/07/2023)   Exercise Vital Sign    Days of Exercise per Week: 6 days    Minutes of Exercise per Session: 60 min  Stress: No Stress Concern Present (11/07/2023)   [employer] of Occupational Health - Occupational Stress Questionnaire    Feeling of Stress : Not at all  Social Connections: Moderately Integrated (11/07/2023)   Social Connection and Isolation Panel [NHANES]    Frequency of Communication with Friends and Family: More than three times a week    Frequency of Social Gatherings with Friends and Family: More than three times a week    Attends Religious Services: More than 4 times per year    Active Member of [employer] or Organizations: Yes    Attends Banker Meetings: More than 4 times per year    Marital Status: Widowed      Cardiovascular Risk Factors: Hypertension  Educational History: 18 years formal education Personal History of Seizures: No  Personal History of Stroke: No  Personal History of Head Trauma: No  Personal History of Psychiatric Disorders: No    Basic Activities of Daily Living  Dressing: Self-care Eating: Self-care Ambulation: Self-care Toileting: Self-care Bathing: Self-care  Instrumental Activities of Daily Living Shopping: Self-care House/Yard Work: Self-care Administration of medications: Self-care Finances: Self-care Telephone: Self-care Transportation: Self-care  Mobility Assist Devices: No assist devices  Caregivers in home: patient and  spouse    Formal Home Health Assistance  Physical Therapy: no  Occupational Therapy: no             Home Aid / Personal Care Service: no             Homemaker services: no  FALLS in last five office visits:     09/06/2023    8:38 AM 05/10/2023   11:07 AM 02/01/2023    4:23 PM 05/05/2021    8:39 AM 07/27/2019    9:36 AM  Fall Risk   Falls in the past year? 0 0 0 0 0  Number falls in past yr: 0 0 0    Injury with Fall? 0 0 0  Risk for fall due to :   No Fall Risks    Follow up   Falls prevention discussed;Education provided;Falls evaluation completed      Health Maintenance reviewed: Immunization History  Administered Date(s) Administered   Fluad Quad(high Dose 65+) 06/17/2022   Influenza Split 07/29/2013   Influenza Whole 07/10/2008, 08/07/2009, 07/05/2010   Influenza, High Dose Seasonal PF 06/18/2011, 06/28/2014, 06/28/2016, 06/28/2017, 06/28/2018, 06/13/2019, 06/09/2023   Influenza,inj,Quad PF,6+ Mos 06/28/2014   Influenza-Unspecified 06/29/2007, 06/28/2009, 06/28/2012, 07/18/2015, 06/29/2017, 06/28/2018, 07/16/2019, 06/24/2020, 07/07/2021, 06/17/2022, 06/09/2023   Moderna Covid-19 Vaccine Bivalent Booster 41yrs & up 07/03/2021   PFIZER Comirnaty(Gray Top)Covid-19 Tri-Sucrose Vaccine 11/21/2019, 12/12/2019, 06/24/2020, 12/27/2020   Pfizer Covid-19 Vaccine Bivalent Booster 50yrs & up 06/25/2021, 01/29/2022   Pfizer(Comirnaty)Fall Seasonal Vaccine 12 years and older 06/17/2022, 06/09/2023   Pneumococcal Conjugate-13 07/29/2013, 08/23/2013   Pneumococcal Polysaccharide-23 07/29/2001, 06/25/2009, 03/21/2018   Pneumococcal-Unspecified 06/29/2007   Respiratory Syncytial Virus Vaccine,Recomb Aduvanted(Arexvy) 10/01/2022   Td 07/29/2001   Tdap 06/28/2008, 08/12/2011, 09/01/2021   Zoster Recombinant(Shingrix) 07/19/2017, 12/06/2017, 07/19/2018, 09/28/2020   Zoster, Live 08/12/2005   Health Maintenance Topics with due status: Overdue     Topic Date Due   COVID-19 Vaccine  12/07/2023   Health Maintenance Topics with due status: Due Soon     Topic Date Due   Medicare Annual Wellness (AWV) 02/01/2024    Diet: Regular   Vital Signs   Vitals:   01/06/24 1435 01/06/24 1535  BP: 110/60   Pulse: 60   SpO2: 93% 98%    There is no height or weight on file to calculate BMI. CrCl cannot be calculated (Patient's most recent lab result is older than the maximum 21 days allowed.). There is no height or weight on file to calculate BSA. There were no vitals filed for this visit. Wt Readings from Last 3 Encounters:  11/09/23 196 lb 12.8 oz (89.3 kg)  09/06/23 194 lb (88 kg)  08/05/23 198 lb 9.6 oz (90.1 kg)   No results found.  Physical Examination:  VS reviewed Physical Exam  Vitals:   01/06/24 1435  Weight: 196 lb 6 oz (89.1 kg)  Height: 5' 7.72" (1.72 m)    HEENT: Bilateral EAC adequately patent General physical exam: well-dressed and groomed. Pleasant and cooperative. Lungs: No increased work of breathing on room air  No other insights were derived from the general Exam   Mini-Mental State Examination or Montreal Cognitive Assessment:  Patient did  require additional cues (only 1 time) or prompts to complete tasks. Patient was cooperative and attentive to testing tasks Patient did  appear motivated to perform well     01/06/2024    4:12 PM  [facility] Cognitive Assessment   Visuospatial/ Executive (0/5) 3  Naming (0/3) 2  Attention: Read list of digits (0/2) 2  Attention: Read list of letters (0/1) 1  Attention: Serial 7 subtraction starting at 100 (0/3) 3  Language: Repeat phrase (0/2) 1  Language : Fluency (0/1) 1  Abstraction (0/2) 2  Delayed Recall (0/5) 3  Orientation (0/6) 6  Total 24  Adjusted Score (based on education) 24        02/01/2023    4:26 PM  6CIT Screen  What Year? 0 points  What month? 0 points  What time? 0 points  Count back from 20 0 points  Months in reverse 0 points  Repeat phrase 0 points  Total Score  0 points       03/22/2014   11:00 AM 06/28/2012  5:00 PM 05/19/2011    2:00 PM  MMSE - Mini Mental State Exam  Orientation to time 4 5 5   Orientation to Place 5 5 5   Registration 3 3 3   Attention/ Calculation 5 5 5   Recall 3 3 3   Language- name 2 objects 2 2 2   Language- repeat 1 1 1   Language- follow 3 step command 3 3 3   Language- read & follow direction 1 1 1   Write a sentence 1 1 1   Copy design 0 1 1  Total score 28 30 30             No data to display           Labs No components found for: "VITAMIND"  Lab Results  Component Value Date   VITAMINB12 633 11/09/2023    No results found for: "FOLATE"  Lab Results  Component Value Date   TSH 2.340 11/09/2023    No results found for: "RPR"  No results found for: "HIV"    Chemistry      Component Value Date/Time   NA 140 11/09/2023 1208   K 4.7 11/09/2023 1208   CL 102 11/09/2023 1208   CO2 24 11/09/2023 1208   BUN 24 11/09/2023 1208   CREATININE 1.18 11/09/2023 1208   CREATININE 1.18 09/15/2016 0833      Component Value Date/Time   CALCIUM 9.8 11/09/2023 1208   ALKPHOS 64 11/09/2023 1208   AST 23 11/09/2023 1208   ALT 20 11/09/2023 1208   BILITOT 0.8 11/09/2023 1208       CrCl cannot be calculated (Patient's most recent lab result is older than the maximum 21 days allowed.).   Lab Results  Component Value Date   HGBA1C 5.8 09/06/2023     @10RELATIVEDAYS @No  results found. Lab Results  Component Value Date   WBC 3.7 11/09/2023   HGB 13.1 11/09/2023   HCT 40.1 11/09/2023   MCV 92 11/09/2023   PLT 220 11/09/2023    No results found for this or any previous visit (from the past 24 hours).  Imaging  Brain MRI: 11/29/23: Unremarkable, NAD   Personal Strengths Ability for insight  Active sense of humor  Average or above average intelligence  Capable of independent living  Arboriculturist fund of knowledge  Motivation for treatment/growth   Physical Health  Special hobby/interest  Supportive family/friends  Work skills   Support System Strengths Supportive Relationships, Friends, Hopefulness, Journalist, newspaper, and Able to Communicate Effectively  ------------------------------------------------------------------------------------------------------------------------------------------------------------------------------------------------------------------------------------------------------------------------------------------------------------------------------------------------------------------------------------------------------------------------------------------------------------------------------------------------------------  Assessment and Plan: Please see individual consultation notes from physical therapy, pharmacy and social work for today.    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 Very independent in abilities. Highly educated. Discussed dx of age related cognitive decline. Also discussed if this were to worsen or any further concerns could consider referral for neuropsychological evaluation.  Chronic idiopathic gout involving toe without tophus, unspecified laterality Hx of gout, currently on hydrochlorothiazide and lisinopril for HTN. -Uric acid -Consider changing lisinopril to losartan if uric acid uncontrolled   Primary Contact: Extended Emergency Contact Information Primary Emergency Contact: Casidy,Tammarah Address: [address], [place] [address] [name] of [place] Mobile Phone: [phone] Relation: Daughter  > 60 minutes face to face were spent in total with precharting, history & physical gathering, nterdisciplinary discussion, patient and caretaker counseling and coordination of care and documentation. The Geriatric  medical team meet to discuss the patient's Greater than 50% of this time was spent in counseling, explanation  of diagnosis, planning of further management, and coordination of care.  The interdisciplinary team consisted of representatives from medicine, pharmacy.  Referral to [facility] for SDOH review and education about resources was made.

## 2024-01-07 ENCOUNTER — Encounter: Payer: Self-pay | Admitting: Student

## 2024-01-07 ENCOUNTER — Encounter: Payer: Self-pay | Admitting: Family Medicine

## 2024-01-07 LAB — URIC ACID: Uric Acid: 6.9 mg/dL (ref 3.8–8.4)

## 2024-01-10 ENCOUNTER — Telehealth: Payer: Self-pay

## 2024-01-10 NOTE — Telephone Encounter (Signed)
 Patient calls nurse line requesting a prescription for Yellow Fever vaccine.  He reports he is going to [place] at the end of June and was advised on taking this prior to travel.   He reports he called around to pharmacies and reports [facility] on [address] carries the vaccine.   Advised will forward to PCP.   [facility] list.

## 2024-01-10 NOTE — Telephone Encounter (Signed)
 Called patient to schedule an appointment per Dr. Jorden Nevin request to discuss yellow fever, typhoid and malaria prophylaxis vaccines.   Was able to schedule patient for 01/21/2024 @ 11:30am.  Patient understood appointment information that was provided to him.  Christ Courier, CMA

## 2024-01-21 ENCOUNTER — Encounter: Payer: Self-pay | Admitting: Family Medicine

## 2024-01-21 ENCOUNTER — Ambulatory Visit: Admitting: Family Medicine

## 2024-01-21 VITALS — BP 112/70 | HR 67 | Wt 194.4 lb

## 2024-01-21 DIAGNOSIS — Z23 Encounter for immunization: Secondary | ICD-10-CM | POA: Diagnosis not present

## 2024-01-21 DIAGNOSIS — Z7184 Encounter for health counseling related to travel: Secondary | ICD-10-CM | POA: Diagnosis not present

## 2024-01-21 MED ORDER — TYPHOID VACCINE PO CPDR: 1.0000 | DELAYED_RELEASE_CAPSULE | ORAL | 0 refills | Status: DC

## 2024-01-21 MED ORDER — DOXYCYCLINE HYCLATE 100 MG PO TABS: 100.0000 mg | ORAL_TABLET | Freq: Two times a day (BID) | ORAL | 0 refills | Status: DC

## 2024-01-21 MED ORDER — ACETAMINOPHEN 500 MG PO TABS
500.0000 mg | ORAL_TABLET | Freq: Four times a day (QID) | ORAL | Status: DC | PRN
  Administered 2024-01-21 (×2): 500 mg via ORAL

## 2024-01-21 MED ORDER — ACETAMINOPHEN 500 MG PO TABS: 500.0000 mg | ORAL_TABLET | Freq: Four times a day (QID) | ORAL | Status: DC | PRN

## 2024-01-21 MED ORDER — YELLOW FEVER VACCINE ~~LOC~~ INJ: 0.5000 mL | INJECTION | Freq: Once | SUBCUTANEOUS | 0 refills | Status: AC

## 2024-01-21 NOTE — Assessment & Plan Note (Signed)
 Travelling to [place]. NCIR reviewed. Hep A given today, plan for 2nd dose in 6-12 months given age >24. Given printed prescriptions for oral typhoid, yellow fever vaccines. Doxycycline  sent for malaria ppx. Reviewed common travel advice, handout on CDC traveller's health for [place] given.

## 2024-01-21 NOTE — Patient Instructions (Signed)
 It was great to see you!  Our plans for today:  - Start the doxycycline  (for malaria prevention) 1-2 days prior to departure, continue during travel and 4 weeks after leaving [place]. - Get your typhoid and yellow fever vaccines at the pharmacy. If they don't have them available, try contacting the health department.   [facility] [address] [place], [place] [address] [phone]  We gave you your hepatitis A vaccine today. It is recommended to repeat a dose in 6-12 months for long term protection.  Check out [employer] for more advice on international travel.   Take care and seek immediate care sooner if you develop any concerns.   Dr. Jacques Willingham

## 2024-01-21 NOTE — Progress Notes (Signed)
   SUBJECTIVE:   CHIEF COMPLAINT / HPI:   Travel visit - going to [place] in 6/19 for 10 days.  - needs to update vaccines.  - Oral typhoid - 1 dose every other day x4 doses. Complete at least 1 week before departure. - Malaria ppx - initiate 1-2 days prior to departure, continue during travel and 4 weeks after leaving.  - Yellow fever - needs to obtain at the health department - Hep A vaccine   OBJECTIVE:   BP 112/70   Pulse 67   Wt 194 lb 6.4 oz (88.2 kg)   SpO2 93%   BMI 29.81 kg/m   Gen: well appearing, in NAD Card: Reg rate Lungs: Comfortable WOB on RA Ext: WWP   ASSESSMENT/PLAN:   Travel advice encounter Travelling to [place]. NCIR reviewed. Hep A given today, plan for 2nd dose in 6-12 months given age >84. Given printed prescriptions for oral typhoid, yellow fever vaccines. Doxycycline  sent for malaria ppx. Reviewed common travel advice, handout on CDC traveller's health for [place] given.      Kandis Ormond, DO

## 2024-01-31 ENCOUNTER — Other Ambulatory Visit (HOSPITAL_COMMUNITY): Payer: Self-pay

## 2024-03-30 ENCOUNTER — Encounter: Payer: Self-pay | Admitting: Family Medicine

## 2024-03-30 ENCOUNTER — Ambulatory Visit: Admitting: Family Medicine

## 2024-03-30 VITALS — BP 120/62 | HR 54 | Temp 97.9°F | Ht 67.0 in | Wt 197.0 lb

## 2024-03-30 DIAGNOSIS — R21 Rash and other nonspecific skin eruption: Secondary | ICD-10-CM

## 2024-03-30 MED ORDER — TRIAMCINOLONE ACETONIDE 0.1 % EX OINT: 1.0000 | TOPICAL_OINTMENT | Freq: Two times a day (BID) | CUTANEOUS | 0 refills | Status: AC

## 2024-03-30 NOTE — Progress Notes (Signed)
    SUBJECTIVE:   CHIEF COMPLAINT / HPI:   Rash - Reports rash on face and abdomen starting several days ago - Recently returned from [place] a couple days ago - Rash is itchy, not painful, not bruising or bleeding - Since onset, rash has remained fairly consistent - No known triggers, does report using hotel provided shampoo/soaps during recent travel - Did receive some bug bites on his legs - Takes loratadine for hayfever/seasonal allergies, no other known allergies - No recent difficulty breathing - No recent fever, cough, congestion, N/V/D  PERTINENT  PMH / PSH: Recent international travel, HTN  OBJECTIVE:   BP 120/62   Pulse (!) 54   Temp 97.9 F (36.6 C) (Oral)   Ht 5' 7 (1.702 m)   Wt 197 lb (89.4 kg)   SpO2 100%   BMI 30.85 kg/m   General: Well-appearing. Resting comfortably in room. CV: Warm and well-perfused. Pulm: Breathing comfortably on room air.  No increased WOB. Abd: Soft, non-tender, non-distended. Skin: Erythematous, raised, papular lesions on the lower forehead and bilateral cheeks.  Erythematous, dry, hyperpigmented patches across left abdomen of varying sizes with small area of erythematous papules near mid axillary line.  No active bleeding or drainage from any lesions. Psych: Pleasant and appropriate.         ASSESSMENT/PLAN:   Assessment & Plan Rash Facial rash appears consistent with seborrheic dermatitis.  Rash across abdomen appears consistent with nonspecific atopic dermatitis. -Discussed Selsun Blue on head and face for facial rash -Discussed triamcinolone ointment for abdominal rash -Discussed avoiding products with harsh chemicals, fragrances, dyes -Discussed routine moisturization   RTC if symptoms are not improving in the next 1 to 2 weeks.  Damien Cassis, MD [facility]

## 2024-03-30 NOTE — Patient Instructions (Addendum)
 Thank you for visiting clinic today and allowing us  to participate in your care!  For the rash on your face, please try using Celsun Blue shampoo. Apply to the rash area and allow it to sit for a few minutes before washing it off.   For the rash on your abdomen, please try using the steroid ointment for symptom relief. Please avoid any products with harsh chemicals, dyes, or fragrances.   Please schedule an appointment as needed if your symptoms are not getting better in the next 1-2 weeks.   Reach out any time with any questions or concerns you may have - we are here for you!  Damien Cassis, MD [facility] [phone]

## 2024-04-06 ENCOUNTER — Ambulatory Visit

## 2024-04-06 ENCOUNTER — Other Ambulatory Visit: Payer: Self-pay | Admitting: Family Medicine

## 2024-04-06 VITALS — Ht 70.0 in | Wt 186.0 lb

## 2024-04-06 DIAGNOSIS — Z0000 Encounter for general adult medical examination without abnormal findings: Secondary | ICD-10-CM

## 2024-04-06 DIAGNOSIS — I10 Essential (primary) hypertension: Secondary | ICD-10-CM

## 2024-04-06 MED ORDER — LISINOPRIL 40 MG PO TABS: 40.0000 mg | ORAL_TABLET | Freq: Every day | ORAL | 3 refills | Status: DC

## 2024-04-06 NOTE — Patient Instructions (Signed)
 Adam Vasquez , Thank you for taking time out of your busy schedule to complete your Annual Wellness Visit with me. I enjoyed our conversation and look forward to speaking with you again next year. I, as well as your care team,  appreciate your ongoing commitment to your health goals. Please review the following plan we discussed and let me know if I can assist you in the future. Your Game plan/ To Do List    Referrals: If you haven't heard from the office you've been referred to, please reach out to them at the phone provided.   Follow up Visits: Next Medicare AWV with our clinical staff: 04/09/2025 at 3:00 pm phone visit with Nurse Health Advisor   Have you seen your provider in the last 6 months (3 months if uncontrolled diabetes)? Yes Next Office Visit with your provider: Patient will call to schedule next appointment as needed.  Clinician Recommendations:  Aim for 30 minutes of exercise or brisk walking, 6-8 glasses of water, and 5 servings of fruits and vegetables each day.       This is a list of the screening recommended for you and due dates:  Health Maintenance  Topic Date Due   Flu Shot  04/28/2024   Medicare Annual Wellness Visit  04/06/2025   DTaP/Tdap/Td vaccine (5 - Td or Tdap) 09/02/2031   Pneumococcal Vaccine for age over 48  Completed   COVID-19 Vaccine  Completed   Zoster (Shingles) Vaccine  Completed   Hepatitis B Vaccine  Aged Out   HPV Vaccine  Aged Out   Meningitis B Vaccine  Aged Out   Hepatitis C Screening  Discontinued    Advanced directives: (In Chart) A copy of your advanced directives are scanned into your chart should your provider ever need it. Advance Care Planning is important because it:  [x]  Makes sure you receive the medical care that is consistent with your values, goals, and preferences  [x]  It provides guidance to your family and loved ones and reduces their decisional burden about whether or not they are making the right decisions based on your  wishes.  Follow the link provided in your after visit summary or read over the paperwork we have mailed to you to help you started getting your Advance Directives in place. If you need assistance in completing these, please reach out to us  so that we can help you!  See attachments for Preventive Care and Fall Prevention Tips.

## 2024-04-06 NOTE — Progress Notes (Signed)
 Because this visit was a virtual/telehealth visit,  certain criteria was not obtained, such a blood pressure, CBG if applicable, and timed get up and go. Any medications not marked as taking were not mentioned during the medication reconciliation part of the visit. Any vitals not documented were not able to be obtained due to this being a telehealth visit or patient was unable to self-report a recent blood pressure reading due to a lack of equipment at home via telehealth. Vitals that have been documented are verbally provided by the patient.   Subjective:   NATHON STEFANSKI is a 83 y.o. who presents for a Medicare Wellness preventive visit.  As a reminder, Annual Wellness Visits don't include a physical exam, and some assessments may be limited, especially if this visit is performed virtually. We may recommend an in-person follow-up visit with your provider if needed.  Visit Complete: Virtual I connected with  Linnie KATHEE Rummer on 04/06/24 by a audio enabled telemedicine application and verified that I am speaking with the correct person using two identifiers.  Patient Location: Home  Provider Location: Home Office  I discussed the limitations of evaluation and management by telemedicine. The patient expressed understanding and agreed to proceed.  Vital Signs: Because this visit was a virtual/telehealth visit, some criteria may be missing or patient reported. Any vitals not documented were not able to be obtained and vitals that have been documented are patient reported.  VideoDeclined- This patient declined Librarian, academic. Therefore the visit was completed with audio only.  Persons Participating in Visit: Patient.  AWV Questionnaire: No: Patient Medicare AWV questionnaire was not completed prior to this visit.  Cardiac Risk Factors include: advanced age (>40men, >26 women);hypertension;male gender;family history of premature cardiovascular disease      Objective:    Today's Vitals   04/06/24 1533  Weight: 186 lb (84.4 kg)  Height: 5' 10 (1.778 m)  PainSc: 0-No pain   Body mass index is 26.69 kg/m.     04/06/2024    3:39 PM 03/30/2024    4:05 PM 01/06/2024    2:37 PM 11/09/2023   11:36 AM 08/05/2023    8:59 AM 05/10/2023   11:07 AM 02/01/2023    4:25 PM  Advanced Directives  Does Patient Have a Medical Advance Directive? Yes No Yes No No No Yes  Type of Estate agent of [place];Living will  Living will    Living will  Does patient want to make changes to medical advance directive? No - Patient declined  No - Patient declined    No - Patient declined  Copy of Healthcare Power of Attorney in Chart? Yes - validated most recent copy scanned in chart (See row information)        Would patient like information on creating a medical advance directive? No - Patient declined No - Patient declined    No - Patient declined     Current Medications (verified) Outpatient Encounter Medications as of 04/06/2024  Medication Sig   Ascorbic Acid (VITAMIN C) 500 MG tablet Take 500 mg by mouth daily.     Cholecalciferol (VITAMIN D3 PO) Take by mouth.   doxycycline  (VIBRA -TABS) 100 MG tablet Take 1 tablet (100 mg total) by mouth 2 (two) times daily.   ferrous sulfate 324 (65 Fe) MG TBEC Take by mouth.   hydrochlorothiazide  (HYDRODIURIL ) 25 MG tablet Take 1 tablet (25 mg total) by mouth daily.   latanoprost (XALATAN) 0.005 % ophthalmic solution Apply 1  drop to eye daily.   lisinopril  (ZESTRIL ) 40 MG tablet Take 1 tablet (40 mg total) by mouth at bedtime.   loratadine (CLARITIN) 10 MG tablet Take 10 mg by mouth daily as needed.    Omega-3 Fatty Acids (RA FISH OIL) 1000 MG CAPS Take 1 capsule by mouth daily. Take 3 capsules by mouth once daily   tadalafil  (CIALIS ) 5 MG tablet Take 5 mg by mouth daily.   testosterone  (ANDROGEL ) 50 MG/5GM (1%) GEL Place onto the skin daily. 5 pumps   triamcinolone  ointment (KENALOG ) 0.1 % Apply 1  Application topically 2 (two) times daily.   typhoid (VIVOTIF) DR capsule Take 1 capsule by mouth every other day. Keep refrigerated. (Patient not taking: Reported on 04/06/2024)   Facility-Administered Encounter Medications as of 04/06/2024  Medication   acetaminophen  (TYLENOL ) tablet 500 mg   acetaminophen  (TYLENOL ) tablet 500 mg    Allergies (verified) Amlodipine    History: Past Medical History:  Diagnosis Date   Allergy    Heart murmur 08/19/2020   08/2020 transthoracic echocardiogram   1. Left ventricular ejection fraction, by estimation, is 60 to 65%. The  left ventricle has normal function. The left ventricle has no regional  wall motion abnormalities. There is moderate left ventricular hypertrophy  of the basal-septal segment. There  is mild left ventricular hypertrophy of the rest of the LV segments. Left  ventricular diastolic par   Heart murmur 08/19/2020   08/2020 transthoracic echocardiogram   1. Left ventricular ejection fraction, by estimation, is 60 to 65%. The  left ventricle has normal function. The left ventricle has no regional  wall motion abnormalities. There is moderate left ventricular hypertrophy  of the basal-septal segment. There  is mild left ventricular hypertrophy of the rest of the LV segments. 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 No pertinent surgical  history. Family History  Problem Relation Age of Onset   Diabetes Mother    Heart disease Mother    Hypertension Mother    Diabetes Father    Other Neg Hx    Social History   Socioeconomic History   Marital status: Widowed    Spouse name: [place]   Number of children: 2   Years of education: 18   Highest education level: Doctorate  Occupational History   Occupation: Pyschologist  Tobacco Use   Smoking status: Former    Current packs/day: 0.00    Types: Cigarettes    Quit date: 05/18/1976    Years since quitting: 47.9   Smokeless tobacco: Never  Substance and Sexual Activity   Alcohol use: Yes    Alcohol/week: 1.0 standard drink of alcohol    Types: 1 Standard drinks or equivalent per week   Drug use: No   Sexual activity: Yes  Other Topics Concern   Not on file  Social History Narrative   Health Care ENJ:Glopjw Ila Hamel and daughter Laverle  Sara Selvidge (c) [phone], (h) [phone]. HCPOA document in ACP section EPIC   End of Life Plan: pt reports having living will.   Any pets: noneDiet: Pt has a varied diet and focuses on fruits and vegetables.  Pt reports not snacking any longer.   Exercise: Pt swims, runs, and does weight resistance 3-4 times a week.Seatbelts:    Pt reports wearing seatbelt when in vehicle.   [place] Exposure/Protection: Pt reports wearing hats when he is running outside and wearing sunscreen at the [employer]: running, music, travel, gardening   Social Drivers of Health   Financial Resource Strain: Low Risk  (04/06/2024)   Overall Financial Resource Strain (CARDIA)    Difficulty of Paying Living Expenses: Not hard at all  Food Insecurity: No Food Insecurity (04/06/2024)   Hunger Vital Sign    Worried About Running Out of Food in the Last Year: Never true    Ran Out of Food in the Last Year: Never true  Transportation Needs: No Transportation Needs (04/06/2024)   PRAPARE - Administrator, Civil Service (Medical): No     Lack of Transportation (Non-Medical): No  Physical Activity: Sufficiently Active (04/06/2024)   Exercise Vital Sign    Days of Exercise per Week: 6 days    Minutes of Exercise per Session: 60 min  Stress: No Stress Concern Present (04/06/2024)   [employer] of Occupational Health - Occupational Stress Questionnaire    Feeling of Stress: Not at all  Social Connections: Moderately Integrated (04/06/2024)   Social Connection and Isolation Panel    Frequency of Communication with Friends and Family: More than three times a week    Frequency of Social Gatherings with Friends and Family: More than three times a week    Attends Religious Services: More than 4 times per year    Active Member of [employer] or Organizations: Yes    Attends Banker Meetings: More than 4 times per year    Marital Status: Widowed    Tobacco Counseling Counseling given: Not Answered    Clinical Intake:  Pre-visit preparation completed: Yes  Pain : No/denies pain Pain Score: 0-No pain     BMI - recorded: 26.69 Nutritional Status: BMI > 30  Obese Nutritional Risks: None Diabetes: No  Lab Results  Component Value Date   HGBA1C 5.8 09/06/2023   HGBA1C 5.8 (H) 05/10/2023   HGBA1C 6.0 (H) 09/03/2022     How often do you need to have someone help you when you read instructions, pamphlets, or other written materials from your doctor or pharmacy?: 1 - Never What is the last grade level you completed in school?: DOCTORATE DEGREE  Interpreter Needed?: No  Information entered by :: Melanie Openshaw N. Cale Decarolis, LPN.   Activities of Daily Living     04/06/2024    3:41 PM  In your present state of health, do you have any difficulty performing the following activities:  Hearing? 0  Vision? 0  Difficulty concentrating or making decisions? 0  Comment Brain Stimulating Exercises: advid reader  Walking or climbing stairs? 0  Dressing or bathing? 0  Doing errands, shopping? 0  Preparing Food and eating  ? N  Using the Toilet? N  In the past six months, have you accidently leaked urine? N  Do you have problems with loss of bowel control? N  Managing your Medications? N  Managing your Finances? N  Housekeeping or managing your Housekeeping? N    Patient Care Team:  Rumball, Alison M, DO as PCP - General (Family Medicine) Octavia Charleston, MD (Ophthalmology) [facility], [facility]  I have updated your Care Teams any recent Medical Services you may have received from other providers in the past year.     Assessment:   This is a routine wellness examination for Jaquavian.  Hearing/Vision screen Hearing Screening - Comments:: Patient denied any hearing difficulty.   No hearing aids.  Vision Screening - Comments:: Patient does wear corrective lenses.  Annual eye exam done by: [facility]    Goals Addressed             This Visit's Progress    04/06/2024: STAY ACTIVE AND OUT OF THE HOSPITAL.         Depression Screen     04/06/2024    3:40 PM 03/30/2024    4:05 PM 01/06/2024    2:37 PM 09/06/2023    9:00 AM 07/16/2023    3:18 PM 05/10/2023   11:07 AM 02/01/2023    4:24 PM  PHQ 2/9 Scores  PHQ - 2 Score 0   0 0 0 0  PHQ- 9 Score 0   0 0 0   Exception Documentation  Patient refusal Patient refusal        Fall Risk     04/06/2024    3:40 PM 03/30/2024    4:05 PM 01/06/2024    2:37 PM 09/06/2023    8:38 AM 05/10/2023   11:07 AM  Fall Risk   Falls in the past year? 0 0 0 0 0  Number falls in past yr: 0 0 0 0 0  Injury with Fall? 0 0 0 0 0  Risk for fall due to : No Fall Risks      Follow up Falls evaluation completed        MEDICARE RISK AT HOME:  Medicare Risk at Home Any stairs in or around the home?: Yes If so, are there any without handrails?: No Home free of loose throw rugs in walkways, pet beds, electrical cords, etc?: Yes Adequate lighting in your home to reduce risk of falls?: Yes Life alert?: No Use of a cane, walker or w/c?: No Grab bars in the bathroom?:  Yes Shower chair or bench in shower?: Yes (BUILT-IN SEAT IN THE SHOWER) Elevated toilet seat or a handicapped toilet?: Yes  TIMED UP AND GO:  Was the test performed?  No  Cognitive Function: Declined/Normal: No cognitive concerns noted by patient or family. Patient alert, oriented, able to answer questions appropriately and recall recent events. No signs of memory loss or confusion.    04/06/2024    3:45 PM 03/22/2014   11:00 AM 06/28/2012    5:00 PM 05/19/2011    2:00 PM  MMSE - Mini Mental State Exam  Not completed: Unable to complete     Orientation to time  4  5  5    Orientation to Place  5  5  5    Registration  3  3  3    Attention/ Calculation  5  5  5    Recall  3  3  3    Language- name 2 objects  2  2  2    Language- repeat  1 1 1   Language- follow 3 step command  3  3  3    Language- read & follow direction  1  1  1    Write a sentence  1  1  1    Copy design  0  1  1   Total score  28  30  30       Data saved with a previous flowsheet row definition      01/06/2024    4:12 PM  Montreal Cognitive Assessment   Visuospatial/ Executive (0/5) 3  Naming (0/3) 2  Attention: Read list of digits (0/2) 2  Attention: Read list of letters (0/1) 1  Attention: Serial 7 subtraction starting at 100 (0/3) 3  Language: Repeat phrase (0/2) 1  Language : Fluency (0/1) 1  Abstraction (0/2) 2  Delayed Recall (0/5) 3  Orientation (0/6) 6  Total 24  Adjusted Score (based on education) 24      04/06/2024    3:43 PM 02/01/2023    4:26 PM  6CIT Screen  What Year? 0 points 0 points  What month? 0 points 0 points  What time? 0 points 0 points  Count back from 20 0 points 0 points  Months in reverse 0 points 0 points  Repeat phrase 0 points 0 points  Total Score 0 points 0 points    Immunizations Immunization History  Administered Date(s) Administered   Fluad Quad(high Dose 65+) 06/17/2022   Hepatitis A, Adult 01/21/2024   Hepatitis A, Ped/Adol-2 Dose 01/21/2024   Influenza Split  07/29/2013   Influenza Whole 07/10/2008, 08/07/2009, 07/05/2010   Influenza, High Dose Seasonal PF 06/18/2011, 06/28/2014, 06/28/2016, 06/28/2017, 06/28/2018, 06/13/2019, 06/09/2023   Influenza,inj,Quad PF,6+ Mos 06/28/2014   Influenza-Unspecified 06/29/2007, 06/28/2009, 06/28/2012, 07/18/2015, 06/29/2017, 06/28/2018, 07/16/2019, 06/24/2020, 07/07/2021, 06/17/2022, 06/09/2023   Moderna Covid-19 Vaccine Bivalent Booster 55yrs & up 07/03/2021   PFIZER Comirnaty(Gray Top)Covid-19 Tri-Sucrose Vaccine 11/21/2019, 12/12/2019, 06/24/2020, 12/27/2020   Pfizer Covid-19 Vaccine Bivalent Booster 35yrs & up 06/25/2021, 01/29/2022   Pfizer(Comirnaty)Fall Seasonal Vaccine 12 years and older 06/17/2022, 06/09/2023   Pneumococcal Conjugate-13 07/29/2013, 08/23/2013   Pneumococcal Polysaccharide-23 07/29/2001, 06/25/2009, 03/21/2018   Pneumococcal-Unspecified 06/29/2007   Respiratory Syncytial Virus Vaccine,Recomb Aduvanted(Arexvy) 10/01/2022   Td 07/29/2001   Tdap 06/28/2008, 08/12/2011, 09/01/2021   Typhoid Inactivated 01/22/2024   Yellow Fever 02/10/2024   Zoster Recombinant(Shingrix) 07/19/2017, 12/06/2017, 07/19/2018, 09/28/2020   Zoster, Live 08/12/2005    Screening Tests Health Maintenance  Topic Date Due   INFLUENZA VACCINE  04/28/2024   Medicare Annual Wellness (AWV)  04/06/2025   DTaP/Tdap/Td (5 - Td or Tdap) 09/02/2031   Pneumococcal Vaccine: 50+ Years  Completed   COVID-19 Vaccine  Completed   Zoster Vaccines- Shingrix  Completed   Hepatitis B Vaccines  Aged Out   HPV VACCINES  Aged Out   Meningococcal B Vaccine  Aged Out   Hepatitis C Screening  Discontinued    Health Maintenance  There are no preventive care reminders to display for this patient.  Health Maintenance Items Addressed: Yes Patient aware of current care gaps.  Immunization record was verified by [facility] and updated in patient's chart. No current care gaps.  Additional Screening:  Vision Screening: Recommended  annual ophthalmology exams for early detection of glaucoma and other disorders of the eye. Would you like a referral to an eye doctor? No    Dental Screening: Recommended annual dental exams for proper oral hygiene  Community Resource Referral / Chronic Care Management: CRR required this visit?  No   CCM required this visit?  No   Plan:    I have personally reviewed and noted the following in the patient's chart:   Medical and social history Use of alcohol, tobacco or illicit drugs  Current medications and supplements including opioid prescriptions. Patient is  not currently taking opioid prescriptions. Functional ability and status Nutritional status Physical activity Advanced directives List of other physicians Hospitalizations, surgeries, and ER visits in previous 12 months Vitals Screenings to include cognitive, depression, and falls Referrals and appointments  In addition, I have reviewed and discussed with patient certain preventive protocols, quality metrics, and best practice recommendations. A written personalized care plan for preventive services as well as general preventive health recommendations were provided to patient.   Roz LOISE Fuller, LPN   2/89/7974   After Visit Summary: (Declined) Due to this being a telephonic visit, with patients personalized plan was offered to patient but patient Declined AVS at this time   Notes: Patient aware of current care gaps.  Immunization record was verified by [facility] and updated in patient's chart. No current care gaps.

## 2024-04-12 DIAGNOSIS — E663 Overweight: Secondary | ICD-10-CM | POA: Diagnosis not present

## 2024-04-12 DIAGNOSIS — Z008 Encounter for other general examination: Secondary | ICD-10-CM | POA: Diagnosis not present

## 2024-04-12 DIAGNOSIS — N1831 Chronic kidney disease, stage 3a: Secondary | ICD-10-CM | POA: Diagnosis not present

## 2024-04-12 DIAGNOSIS — I129 Hypertensive chronic kidney disease with stage 1 through stage 4 chronic kidney disease, or unspecified chronic kidney disease: Secondary | ICD-10-CM | POA: Diagnosis not present

## 2024-04-12 DIAGNOSIS — R7303 Prediabetes: Secondary | ICD-10-CM | POA: Diagnosis not present

## 2024-04-12 DIAGNOSIS — Z6826 Body mass index (BMI) 26.0-26.9, adult: Secondary | ICD-10-CM | POA: Diagnosis not present

## 2024-04-25 LAB — LAB REPORT - SCANNED: EGFR: 56

## 2024-07-10 ENCOUNTER — Other Ambulatory Visit: Payer: Self-pay | Admitting: Family Medicine

## 2024-07-10 DIAGNOSIS — I10 Essential (primary) hypertension: Secondary | ICD-10-CM

## 2024-09-05 NOTE — Progress Notes (Unsigned)
    SUBJECTIVE:   CHIEF COMPLAINT / HPI:   Discussed the use of AI scribe software for clinical note transcription with the patient, who gave verbal consent to proceed.  History of Present Illness   Any gout flares?   OBJECTIVE:   There were no vitals taken for this visit.  Gen: well appearing, in NAD Card: RRR Lungs: CTAB Ext: WWP, no edema ***  ASSESSMENT/PLAN:   No problem-specific Assessment & Plan notes found for this encounter.     Assessment and Plan Assessment & Plan         Donald CHRISTELLA Lai, DO

## 2024-09-06 ENCOUNTER — Encounter: Payer: Self-pay | Admitting: Family Medicine

## 2024-09-06 ENCOUNTER — Ambulatory Visit (INDEPENDENT_AMBULATORY_CARE_PROVIDER_SITE_OTHER): Admitting: Family Medicine

## 2024-09-06 VITALS — BP 110/64 | HR 60 | Ht 70.0 in | Wt 192.8 lb

## 2024-09-06 DIAGNOSIS — H40053 Ocular hypertension, bilateral: Secondary | ICD-10-CM | POA: Diagnosis not present

## 2024-09-06 DIAGNOSIS — R7989 Other specified abnormal findings of blood chemistry: Secondary | ICD-10-CM

## 2024-09-06 DIAGNOSIS — I10 Essential (primary) hypertension: Secondary | ICD-10-CM

## 2024-09-06 DIAGNOSIS — Z125 Encounter for screening for malignant neoplasm of prostate: Secondary | ICD-10-CM

## 2024-09-06 DIAGNOSIS — M1020 Drug-induced gout, unspecified site: Secondary | ICD-10-CM

## 2024-09-06 DIAGNOSIS — M109 Gout, unspecified: Secondary | ICD-10-CM | POA: Insufficient documentation

## 2024-09-06 MED ORDER — LISINOPRIL 20 MG PO TABS: 20.0000 mg | ORAL_TABLET | Freq: Every day | ORAL | 0 refills | Status: AC

## 2024-09-06 NOTE — Assessment & Plan Note (Signed)
 Treated by Endocrinology at [facility] with topical testosterone  gel

## 2024-09-06 NOTE — Assessment & Plan Note (Signed)
 Only one prior flare, question if was truly gout given no recurrent and back on hydrochlorothiazide . Last uric acid at goal. Continue to monitor.

## 2024-09-06 NOTE — Patient Instructions (Signed)
 It was great to see you!  Our plans for today:  - Decrease your lisinopril  to 20mg . I sent a new prescription to your pharmacy. You can take 1/2 tablet of your current 40mg  pill.  - I will be on the lookout for your recent labwork from the [place].  - Monitor your blood pressure at home and keep a log of your readings. Make sure to be seated for at least 5 minutes prior to testing and not in pain or worked up for the most accurate readings. Bring this log with you to follow up.  - Come back in 1 month after starting 20mg  pill.  Take care and seek immediate care sooner if you develop any concerns.   Dr. Robel Wuertz

## 2024-09-06 NOTE — Assessment & Plan Note (Signed)
 Followed by ophthalmology at [facility], continue.

## 2024-09-06 NOTE — Assessment & Plan Note (Addendum)
 Low normal today, will reduce lisinopril  to 20mg . Prefers to run out of current dose of lisinopril  before starting lower dose, counseled on cutting in half. RTC 1 month after starting lower dose, labs at that time. Continue current dose of hydrochlorothiazide . Will look out for recent labs from [place].

## 2025-04-09 ENCOUNTER — Encounter
